# Patient Record
Sex: Male | Born: 1995 | Hispanic: No | Marital: Single | Smoking: Never smoker
Health system: Southern US, Community
[De-identification: ages and names within clinical notes are randomized; demographics above are authoritative.]

## PROBLEM LIST (undated history)

## (undated) DIAGNOSIS — R000 Tachycardia, unspecified: Secondary | ICD-10-CM

## (undated) DIAGNOSIS — K219 Gastro-esophageal reflux disease without esophagitis: Secondary | ICD-10-CM

## (undated) DIAGNOSIS — A048 Other specified bacterial intestinal infections: Secondary | ICD-10-CM

## (undated) DIAGNOSIS — F419 Anxiety disorder, unspecified: Secondary | ICD-10-CM

## (undated) HISTORY — DX: Tachycardia, unspecified: R00.0

## (undated) HISTORY — DX: Other specified bacterial intestinal infections: A04.8

## (undated) HISTORY — PX: NO PAST SURGERIES: SHX2092

## (undated) HISTORY — DX: Anxiety disorder, unspecified: F41.9

## (undated) HISTORY — DX: Gastro-esophageal reflux disease without esophagitis: K21.9

---

## 2017-11-19 DIAGNOSIS — A048 Other specified bacterial intestinal infections: Secondary | ICD-10-CM | POA: Insufficient documentation

## 2017-11-19 DIAGNOSIS — R109 Unspecified abdominal pain: Secondary | ICD-10-CM | POA: Insufficient documentation

## 2018-11-19 DIAGNOSIS — M25369 Other instability, unspecified knee: Secondary | ICD-10-CM | POA: Insufficient documentation

## 2020-04-19 DIAGNOSIS — R6889 Other general symptoms and signs: Secondary | ICD-10-CM | POA: Insufficient documentation

## 2020-04-19 DIAGNOSIS — H5713 Ocular pain, bilateral: Secondary | ICD-10-CM | POA: Insufficient documentation

## 2020-05-19 DIAGNOSIS — Z8619 Personal history of other infectious and parasitic diseases: Secondary | ICD-10-CM | POA: Insufficient documentation

## 2020-11-07 DIAGNOSIS — K0889 Other specified disorders of teeth and supporting structures: Secondary | ICD-10-CM | POA: Insufficient documentation

## 2020-11-15 ENCOUNTER — Other Ambulatory Visit: Payer: Self-pay | Admitting: Internal Medicine

## 2020-11-16 LAB — COMPREHENSIVE METABOLIC PANEL
ALT: 28 IU/L (ref 0–44)
AST: 26 IU/L (ref 0–40)
Albumin/Globulin Ratio: 2 (ref 1.2–2.2)
Albumin: 4.9 g/dL (ref 4.1–5.2)
Alkaline Phosphatase: 90 IU/L (ref 44–121)
BUN/Creatinine Ratio: 19 (ref 9–20)
BUN: 15 mg/dL (ref 6–20)
Bilirubin Total: 0.5 mg/dL (ref 0.0–1.2)
CO2: 24 mmol/L (ref 20–29)
Calcium: 9.9 mg/dL (ref 8.7–10.2)
Chloride: 103 mmol/L (ref 96–106)
Creatinine, Ser: 0.81 mg/dL (ref 0.76–1.27)
GFR calc Af Amer: 146 mL/min/{1.73_m2} (ref >59)
GFR calc non Af Amer: 126 mL/min/{1.73_m2} (ref >59)
Globulin, Total: 2.4 g/dL (ref 1.5–4.5)
Glucose: 94 mg/dL (ref 65–99)
Potassium: 4.7 mmol/L (ref 3.5–5.2)
Sodium: 140 mmol/L (ref 134–144)
Total Protein: 7.3 g/dL (ref 6.0–8.5)

## 2020-11-16 LAB — URINALYSIS, ROUTINE W REFLEX MICROSCOPIC
Bilirubin, UA: NEGATIVE
Glucose, UA: NEGATIVE
Ketones, UA: NEGATIVE
Leukocytes,UA: NEGATIVE
Nitrite, UA: NEGATIVE
Protein,UA: NEGATIVE
RBC, UA: NEGATIVE
Specific Gravity, UA: 1.025 (ref 1.005–1.030)
Urobilinogen, Ur: 0.2 mg/dL (ref 0.2–1.0)
pH, UA: 5.5 (ref 5.0–7.5)

## 2020-11-16 LAB — CBC WITH DIFFERENTIAL/PLATELET
Basophils Absolute: 0 10*3/uL (ref 0.0–0.2)
Basos: 1 %
EOS (ABSOLUTE): 0.3 10*3/uL (ref 0.0–0.4)
Eos: 3 %
Hematocrit: 47.6 % (ref 37.5–51.0)
Hemoglobin: 16.2 g/dL (ref 13.0–17.7)
Immature Grans (Abs): 0 10*3/uL (ref 0.0–0.1)
Immature Granulocytes: 0 %
Lymphocytes Absolute: 3.6 10*3/uL — ABNORMAL HIGH (ref 0.7–3.1)
Lymphs: 47 %
MCH: 28.8 pg (ref 26.6–33.0)
MCHC: 34 g/dL (ref 31.5–35.7)
MCV: 85 fL (ref 79–97)
Monocytes Absolute: 0.7 10*3/uL (ref 0.1–0.9)
Monocytes: 8 %
Neutrophils Absolute: 3.2 10*3/uL (ref 1.4–7.0)
Neutrophils: 41 %
Platelets: 304 10*3/uL (ref 150–450)
RBC: 5.63 x10E6/uL (ref 4.14–5.80)
RDW: 12.3 % (ref 11.6–15.4)
WBC: 7.8 10*3/uL (ref 3.4–10.8)

## 2020-11-16 LAB — LIPID PANEL W/O CHOL/HDL RATIO
Cholesterol, Total: 104 mg/dL (ref 100–199)
HDL: 46 mg/dL (ref >39)
LDL Chol Calc (NIH): 42 mg/dL (ref 0–99)
Triglycerides: 77 mg/dL (ref 0–149)
VLDL Cholesterol Cal: 16 mg/dL (ref 5–40)

## 2020-11-16 LAB — T4, FREE: Free T4: 1.39 ng/dL (ref 0.82–1.77)

## 2020-11-16 LAB — TSH: TSH: 4.34 u[IU]/mL (ref 0.450–4.500)

## 2020-11-16 LAB — HGB A1C W/O EAG: Hgb A1c MFr Bld: 5.5 % (ref 4.8–5.6)

## 2020-12-03 ENCOUNTER — Other Ambulatory Visit: Payer: Self-pay

## 2020-12-03 ENCOUNTER — Encounter: Payer: Self-pay | Admitting: Internal Medicine

## 2020-12-03 ENCOUNTER — Ambulatory Visit: Payer: Self-pay | Admitting: Internal Medicine

## 2020-12-03 VITALS — BP 124/80 | HR 82

## 2020-12-03 DIAGNOSIS — H538 Other visual disturbances: Secondary | ICD-10-CM

## 2020-12-03 DIAGNOSIS — N3943 Post-void dribbling: Secondary | ICD-10-CM

## 2020-12-03 DIAGNOSIS — K219 Gastro-esophageal reflux disease without esophagitis: Secondary | ICD-10-CM

## 2020-12-03 DIAGNOSIS — F064 Anxiety disorder due to known physiological condition: Secondary | ICD-10-CM

## 2020-12-03 MED ORDER — ESCITALOPRAM OXALATE 10 MG PO TABS: 10.0000 mg | ORAL_TABLET | Freq: Every day | ORAL | 1 refills | Status: DC

## 2020-12-03 MED ORDER — OMEPRAZOLE 20 MG PO CPDR: 20.0000 mg | DELAYED_RELEASE_CAPSULE | Freq: Every day | ORAL | 3 refills | Status: DC

## 2020-12-03 NOTE — Progress Notes (Signed)
Internal MEDICINE  Office Visit Note  Patient Name: Peter Snow  [id number]  [id number]  Date of Service: 12/03/2020  Chief Complaint  Patient presents with  . Urinary Frequency       . Eye Problem  . GI Problem  . Stress    HPI Pt is connected via tele visit, has following complaints  1. Under a great deal of stress, unable to rest, friends call him at various times, immigrant from [place]  2. discolored urine with dribbling, denies any burning  3. Eye pain after being on the phone for a while, problem with reading as well, eyes get red  4. Has h/o H pylori, was treated however c/o abdominal pain and heart burn  Current Medication: Outpatient Encounter Medications as of 12/03/2020  Medication Sig  . escitalopram (LEXAPRO) 10 MG tablet Take 1 tablet (10 mg total) by mouth daily. With supper for stress  . omeprazole (PRILOSEC) 20 MG capsule Take 1 capsule (20 mg total) by mouth daily.   No facility-administered encounter medications on file as of 12/03/2020.    Surgical History: History reviewed. No pertinent surgical history.  Medical History: No past medical history on file.  Family History: History reviewed. No pertinent family history.  Social History   Socioeconomic History  . Marital status: Single    Spouse name: Not on file  . Number of children: Not on file  . Years of education: Not on file  . Highest education level: Not on file  Occupational History  . Occupation: unemployed  Tobacco Use  . Smoking status: Never Smoker  . Smokeless tobacco: Never Used  Vaping Use  . Vaping Use: Never used  Substance and Sexual Activity  . Alcohol use: Never  . Drug use: Never  . Sexual activity: Not on file  Other Topics Concern  . Not on file  Social History Narrative  . Not on file   Social Determinants of Health   Financial Resource Strain: Not on file  Food Insecurity: Not on file  Transportation Needs: Not on file  Physical Activity: Not on  file  Stress: Not on file  Social Connections: Not on file  Intimate Partner Violence: Not on file      Review of Systems  Eyes: Positive for redness.  Cardiovascular: Negative.   Genitourinary: Positive for decreased urine volume. Negative for scrotal swelling.       Dribbling   Psychiatric/Behavioral: Positive for decreased concentration, dysphoric mood and sleep disturbance. The patient is nervous/anxious.     Vital Signs: BP 124/80   Pulse 82   SpO2 98%    Physical Exam NAD    Assessment/Plan: 1. GERD without esophagitis Might need future re-testing for H pylori, will start omeprazole  - omeprazole (PRILOSEC) 20 MG capsule; Take 1 capsule (20 mg total) by mouth daily.  Dispense: 30 capsule; Refill: 3  2. 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 With supper for stress  Dispense: 30 tablet; Refill: 1  4. Blurred vision Try reading glasses if no improvement will be seen by ophthalmology  - Ambulatory referral to Ophthalmology  General Counseling: Peter Snow verbalizes understanding of the findings of todays visit and agrees with plan of treatment. I have discussed any further diagnostic  evaluation that may be needed or ordered today. We also reviewed his medications today. he has been encouraged to call the office with any questions or concerns that should arise related to todays visit.    Orders Placed This Encounter  Procedures  . Urinalysis, Routine w reflex microscopic  . Ambulatory referral to Ophthalmology    Meds ordered this encounter  Medications  . escitalopram (LEXAPRO) 10 MG tablet    Sig: Take 1 tablet (10 mg total) by mouth daily. With supper for stress    Dispense:  30  tablet    Refill:  1  . omeprazole (PRILOSEC) 20 MG capsule    Sig: Take 1 capsule (20 mg total) by mouth daily.    Dispense:  30 capsule    Refill:  3    Total time spent:10 Minutes Time spent includes review of chart, medications, test results, and follow up plan with the patient.      Dr Lyndon Code Internal medicine

## 2020-12-03 NOTE — Progress Notes (Signed)
Medication Note  Omeprazole - Dispensed 60 capsules Remaining Refills: 2  Escitalopram (Lexapro) - Dispensed 60 tablets Remaining Refills: 0

## 2021-03-28 ENCOUNTER — Other Ambulatory Visit: Payer: Self-pay

## 2021-03-28 ENCOUNTER — Ambulatory Visit (HOSPITAL_COMMUNITY)
Admission: EM | Admit: 2021-03-28 | Discharge: 2021-03-28 | Disposition: A | Discharge status: Home / Self Care | Payer: Medicaid Other | Attending: Internal Medicine | Admitting: Internal Medicine

## 2021-03-28 DIAGNOSIS — B3789 Other sites of candidiasis: Secondary | ICD-10-CM

## 2021-03-28 DIAGNOSIS — L0291 Cutaneous abscess, unspecified: Secondary | ICD-10-CM

## 2021-03-28 MED ORDER — DOXYCYCLINE HYCLATE 100 MG PO CAPS: 100.0000 mg | ORAL_CAPSULE | Freq: Two times a day (BID) | ORAL | 0 refills | Status: DC

## 2021-03-28 MED ORDER — NYSTATIN 100000 UNIT/GM EX CREA: TOPICAL_CREAM | CUTANEOUS | 0 refills | Status: DC

## 2021-03-28 NOTE — Discharge Instructions (Addendum)
Apply heat on the abscess over the gauze/bandage for 20 minutes. With a heating pack, or get in the bath tub and sit on very warm water. The heat will help the puss continue to drain.  Come back if you get worse.   Call [facility] and ask you need to get established with a primary care doctor, so you can have a physical/check up and blood work.

## 2021-03-28 NOTE — ED Provider Notes (Signed)
[facility]    CSN: [id number] Arrival date & time: 03/28/21  1608      History   Chief Complaint Chief Complaint  Patient presents with  . Abscess    HPI Peter Snow is a 25 y.o. male who developed an abscess on his R buttocks cheek one week ago, but area got worse x 3 days and been draining today.   2- has been having perianal itching x 3 months, he gets a white skin color changes and when he washed it this week was painful.   He does not have a PCP Past Medical History:  Diagnosis Date  . H. pylori infection     Patient Active Problem List   Diagnosis Date Noted  . Tooth pain 11/07/2020  . Gonorrhea 05/2020  . Eye pain, bilateral 04/2020  . Forgetfulness 04/2020  . Knee buckling 2020  . Stomach pain 2019  . Helicobacter pylori stool test positive 2019    No past surgical history on file.     Home Medications    Prior to Admission medications   Medication Sig Start Date End Date Taking? Authorizing Provider  doxycycline (VIBRAMYCIN) 100 MG capsule Take 1 capsule (100 mg total) by mouth 2 (two) times daily. For abscess 03/28/21  Yes Rodriguez-Southworth, Nettie Elm, PA-C  nystatin cream (MYCOSTATIN) Apply to affected area 2 times daily x 7 days for fungal infection 03/28/21  Yes Rodriguez-Southworth, Nettie Elm, PA-C  escitalopram (LEXAPRO) 10 MG tablet Take 1 tablet (10 mg total) by mouth daily. With supper for stress 12/03/20   Lyndon Code, MD  omeprazole (PRILOSEC) 20 MG capsule Take 1 capsule (20 mg total) by mouth daily. 12/03/20   Lyndon Code, MD    Family History No family history on file.  Social History Social History   Tobacco Use  . Smoking status: Never Smoker  . Smokeless tobacco: Never Used  Vaping Use  . Vaping Use: Never used  Substance Use Topics  . Alcohol use: Never  . Drug use: Never     Allergies   Patient has no known allergies.   Review of Systems Review of Systems  Constitutional: Positive for appetite  change and unexpected weight change. Negative for fever.  Gastrointestinal: Negative for abdominal pain, diarrhea, nausea and vomiting.  Genitourinary:       + perianal itching   Musculoskeletal: Negative for gait problem and myalgias.  Skin: Positive for wound. Negative for rash.       Gets perianal itching x 3 months and gets white matter around anal skin. Denies internal anal itching.      Physical Exam Triage Vital Signs ED Triage Vitals  Enc Vitals Group     BP 03/28/21 1721 123/88     Pulse Rate 03/28/21 1721 89     Resp 03/28/21 1721 17     Temp 03/28/21 1721 98.6 F (37 C)     Temp Source 03/28/21 1721 Oral     SpO2 03/28/21 1721 100 %     Weight --      Height --      Head Circumference --      Peak Flow --      Pain Score 03/28/21 1718 6     Pain Loc --      Pain Edu? --      Excl. in GC? --    No data found.  Updated Vital Signs BP 123/88 (BP Location: Right Arm)   Pulse 89   Temp  98.6 F (37 C) (Oral)   Resp 17   SpO2 100%   Visual Acuity Right Eye Distance:   Left Eye Distance:   Bilateral Distance:    Right Eye Near:   Left Eye Near:    Bilateral Near:     Physical Exam Vitals and nursing note reviewed.  Constitutional:      General: He is not in acute distress.    Appearance: He is normal weight. He is not toxic-appearing.  HENT:     Right Ear: External ear normal.     Left Ear: External ear normal.  Eyes:     General: No scleral icterus.    Conjunctiva/sclera: Conjunctivae normal.  Pulmonary:     Effort: Pulmonary effort is normal.  Genitourinary:    Rectum: Normal.     Comments: No rashes noted around perianal region  Musculoskeletal:        General: Normal range of motion.     Cervical back: Neck supple.  Skin:    General: Skin is warm and dry.     Findings: No rash.     Comments: R BUTTOCKS- with 3x3 cm abscess which is draining. Does not extend to the anal area. I massaged it to drain some more of the puss since it is open and  got about 3 ml more. I reapplied dressing and tape over it.   Neurological:     Mental Status: He is alert and oriented to person, place, and time.     Gait: Gait normal.  Psychiatric:        Mood and Affect: Mood normal.        Behavior: Behavior normal.        Thought Content: Thought content normal.        Judgment: Judgment normal.      UC Treatments / Results  Labs (all labs ordered are listed, but only abnormal results are displayed) Labs Reviewed - No data to display  EKG   Radiology No results found.  Procedures Procedures (including critical care time)  Medications Ordered in UC Medications - No data to display  Initial Impression / Assessment and Plan / UC Course  I have reviewed the triage vital signs and the nursing notes. I placed him on Doxy for 10 days as noted. See instructions.  I also prescribed him Nystatin cream for the intermittent itching he gets Needs to get established to see a PCP for a physical.    Final Clinical Impressions(s) / UC Diagnoses   Final diagnoses:  Perianal candidiasis  Abscess     Discharge Instructions     Apply heat on the abscess over the gauze/bandage for 20 minutes. With a heating pack, or get in the bath tub and sit on very warm water. The heat will help the puss continue to drain.  Come back if you get worse.   Call [facility] and ask you need to get established with a primary care doctor, so you can have a physical/check up and blood work.     ED Prescriptions    Medication Sig Dispense Auth. Provider   doxycycline (VIBRAMYCIN) 100 MG capsule Take 1 capsule (100 mg total) by mouth 2 (two) times daily. For abscess 20 capsule Rodriguez-Southworth, Nettie Elm, PA-C   nystatin cream (MYCOSTATIN) Apply to affected area 2 times daily x 7 days for fungal infection 30 g Rodriguez-Southworth, Nettie Elm, PA-C     PDMP not reviewed this encounter.   Garey Ham, [place] 03/28/21 2058

## 2021-03-28 NOTE — ED Triage Notes (Addendum)
Pt reports having an abscess in the right buttock x 1 week, bloody drainage x 4 days, low appetite x 3 days,, fever last night.   Pt reports swelling and pain in the left ankle x 1 week from scratching.

## 2021-04-04 ENCOUNTER — Ambulatory Visit
Admission: RE | Admit: 2021-04-04 | Discharge: 2021-04-04 | Disposition: A | Discharge status: Home / Self Care | Payer: No Typology Code available for payment source | Source: Ambulatory Visit | Attending: Obstetrics and Gynecology | Admitting: Obstetrics and Gynecology

## 2021-04-04 ENCOUNTER — Other Ambulatory Visit: Payer: Self-pay | Admitting: Obstetrics and Gynecology

## 2021-04-04 ENCOUNTER — Other Ambulatory Visit: Payer: Self-pay

## 2021-04-04 DIAGNOSIS — Z111 Encounter for screening for respiratory tuberculosis: Secondary | ICD-10-CM

## 2021-09-01 ENCOUNTER — Emergency Department (HOSPITAL_BASED_OUTPATIENT_CLINIC_OR_DEPARTMENT_OTHER): Payer: BC Managed Care – PPO

## 2021-09-01 ENCOUNTER — Emergency Department (HOSPITAL_BASED_OUTPATIENT_CLINIC_OR_DEPARTMENT_OTHER)
Admission: EM | Admit: 2021-09-01 | Discharge: 2021-09-01 | Disposition: A | Discharge status: Home / Self Care | Payer: BC Managed Care – PPO | Attending: Emergency Medicine | Admitting: Emergency Medicine

## 2021-09-01 ENCOUNTER — Other Ambulatory Visit: Payer: Self-pay

## 2021-09-01 ENCOUNTER — Encounter (HOSPITAL_BASED_OUTPATIENT_CLINIC_OR_DEPARTMENT_OTHER): Payer: Self-pay

## 2021-09-01 DIAGNOSIS — R0789 Other chest pain: Secondary | ICD-10-CM | POA: Insufficient documentation

## 2021-09-01 DIAGNOSIS — R634 Abnormal weight loss: Secondary | ICD-10-CM | POA: Insufficient documentation

## 2021-09-01 DIAGNOSIS — R5381 Other malaise: Secondary | ICD-10-CM | POA: Insufficient documentation

## 2021-09-01 DIAGNOSIS — R5383 Other fatigue: Secondary | ICD-10-CM | POA: Insufficient documentation

## 2021-09-01 DIAGNOSIS — R079 Chest pain, unspecified: Secondary | ICD-10-CM | POA: Diagnosis present

## 2021-09-01 DIAGNOSIS — K2900 Acute gastritis without bleeding: Secondary | ICD-10-CM

## 2021-09-01 DIAGNOSIS — R0602 Shortness of breath: Secondary | ICD-10-CM | POA: Diagnosis not present

## 2021-09-01 LAB — CBC
HCT: 45.1 % (ref 39.0–52.0)
Hemoglobin: 15 g/dL (ref 13.0–17.0)
MCH: 27.8 pg (ref 26.0–34.0)
MCHC: 33.3 g/dL (ref 30.0–36.0)
MCV: 83.7 fL (ref 80.0–100.0)
Platelets: 292 10*3/uL (ref 150–400)
RBC: 5.39 MIL/uL (ref 4.22–5.81)
RDW: 13 % (ref 11.5–15.5)
WBC: 7.5 10*3/uL (ref 4.0–10.5)
nRBC: 0 % (ref 0.0–0.2)

## 2021-09-01 LAB — BASIC METABOLIC PANEL
Anion gap: 6 (ref 5–15)
BUN: 14 mg/dL (ref 6–20)
CO2: 31 mmol/L (ref 22–32)
Calcium: 9.5 mg/dL (ref 8.9–10.3)
Chloride: 104 mmol/L (ref 98–111)
Creatinine, Ser: 0.96 mg/dL (ref 0.61–1.24)
GFR, Estimated: >60 mL/min (ref >60)
Glucose, Bld: 90 mg/dL (ref 70–99)
Potassium: 4.2 mmol/L (ref 3.5–5.1)
Sodium: 141 mmol/L (ref 135–145)

## 2021-09-01 LAB — URINALYSIS, ROUTINE W REFLEX MICROSCOPIC
Bilirubin Urine: NEGATIVE
Glucose, UA: NEGATIVE mg/dL
Hgb urine dipstick: NEGATIVE
Ketones, ur: NEGATIVE mg/dL
Leukocytes,Ua: NEGATIVE
Nitrite: NEGATIVE
Protein, ur: NEGATIVE mg/dL
Specific Gravity, Urine: 1.019 (ref 1.005–1.030)
pH: 7 (ref 5.0–8.0)

## 2021-09-01 LAB — TROPONIN I (HIGH SENSITIVITY): Troponin I (High Sensitivity): <2 ng/L (ref <18)

## 2021-09-01 MED ORDER — AMOXICILLIN 500 MG PO TABS: 500.0000 mg | ORAL_TABLET | Freq: Two times a day (BID) | ORAL | 0 refills | Status: DC

## 2021-09-01 MED ORDER — CLARITHROMYCIN 500 MG PO TABS: 500.0000 mg | ORAL_TABLET | Freq: Two times a day (BID) | ORAL | 0 refills | Status: DC

## 2021-09-01 MED ORDER — LACTATED RINGERS IV BOLUS
1000.0000 mL | Freq: Once | INTRAVENOUS | Status: AC
  Administered 2021-09-01: 1000 mL via INTRAVENOUS

## 2021-09-01 MED ORDER — OMEPRAZOLE 20 MG PO CPDR: 20.0000 mg | DELAYED_RELEASE_CAPSULE | Freq: Two times a day (BID) | ORAL | 0 refills | Status: DC

## 2021-09-01 NOTE — ED Triage Notes (Signed)
Pt reports chest pain and SHOB x10 days. Denies abdominal pain and N/V/D.

## 2021-09-01 NOTE — ED Notes (Addendum)
Pt concerned for right side chest pain, weight loss of approx 3kg in 10 days, loss of appetite and SOB.  Pt denies n/v/d, fever, night sweats, cough, blood in sputum or other symptoms.  Pt speaking [place] with visitor at bedside but declines translator services.  Pt conversing in [place] with RN.

## 2021-09-01 NOTE — Discharge Instructions (Addendum)
Only take the antibiotic if the breath test is positive.  Start taking the omeprazole no matter what for the next 2 weeks. Avoid spicy foods, ibuprofen or acidic foods.  Follow up with gastroenterology if not improving.

## 2021-09-01 NOTE — ED Provider Notes (Signed)
[facility] EMERGENCY DEPT Provider Note   CSN: [id number] Arrival date & time: 09/01/21  1716     History Chief Complaint  Patient presents with   Chest Pain    Peter Snow is a 25 y.o. male.  Patient is a 25 year old male who immigrated from [place] approximately 6 to 7 months ago who is presenting today with complaints of 1 month of fatigue, occasional body aches and at times exertional dyspnea.  He reports for the last 1 week he has had discomfort and pain in his lungs and chest.  He denies any cough, night sweats, fever.  He does report that anytime he eats he feels like he gets full very quickly.  He has lost weight but denies any diarrhea.  Sometimes will have upper abdominal pain but it is not constant.  He is not currently taking any medication but does report when he moved here he was told that he might have latent TB.  He did get a chest x-ray but never got the results of that chest x-ray.  He has never taken any antibiotics since being here.  The history is provided by the patient. The history is limited by a language barrier. A language interpreter was used.  Chest Pain     Past Medical History:  Diagnosis Date   H. pylori infection     Patient Active Problem List   Diagnosis Date Noted   Tooth pain 11/07/2020   Gonorrhea 05/2020   Eye pain, bilateral 04/2020   Forgetfulness 04/2020   Knee buckling 2020   Stomach pain 2019   Helicobacter pylori stool test positive 2019    History reviewed. No pertinent surgical history.     History reviewed. No pertinent family history.  Social History   Tobacco Use   Smoking status: Never   Smokeless tobacco: Never  Vaping Use   Vaping Use: Never used  Substance Use Topics   Alcohol use: Never   Drug use: Never    Home Medications Prior to Admission medications   Medication Sig Start Date End Date Taking? Authorizing Provider  doxycycline (VIBRAMYCIN) 100 MG capsule Take 1 capsule (100  mg total) by mouth 2 (two) times daily. For abscess 03/28/21   Rodriguez-Southworth, Nettie Elm, PA-C  escitalopram (LEXAPRO) 10 MG tablet Take 1 tablet (10 mg total) by mouth daily. With supper for stress 12/03/20   Lyndon Code, MD  nystatin cream (MYCOSTATIN) Apply to affected area 2 times daily x 7 days for fungal infection 03/28/21   Rodriguez-Southworth, Nettie Elm, PA-C  omeprazole (PRILOSEC) 20 MG capsule Take 1 capsule (20 mg total) by mouth daily. 12/03/20   Lyndon Code, MD    Allergies    Patient has no known allergies.  Review of Systems   Review of Systems  Cardiovascular:  Positive for chest pain.  All other systems reviewed and are negative.  Physical Exam Updated Vital Signs BP 137/85 (BP Location: Right Arm)   Pulse 86   Temp 98.1 F (36.7 C)   Resp 18   Ht 5\' 6"  (1.676 m)   Wt 55.6 kg   SpO2 100%   BMI 19.78 kg/m   Physical Exam Vitals and nursing note reviewed.  Constitutional:      General: He is not in acute distress.    Appearance: He is well-developed.  HENT:     Head: Normocephalic and atraumatic.  Eyes:     Conjunctiva/sclera: Conjunctivae normal.     Pupils: Pupils are equal, round, and  reactive to light.  Cardiovascular:     Rate and Rhythm: Normal rate and regular rhythm.     Pulses: Normal pulses.     Heart sounds: No murmur heard. Pulmonary:     Effort: Pulmonary effort is normal. No respiratory distress.     Breath sounds: Normal breath sounds. No wheezing or rales.  Abdominal:     General: Abdomen is flat. There is no distension.     Palpations: Abdomen is soft. There is no mass.     Tenderness: There is no abdominal tenderness. There is no guarding or rebound.  Musculoskeletal:        General: No tenderness. Normal range of motion.     Cervical back: Normal range of motion and neck supple.     Right lower leg: No edema.     Left lower leg: No edema.  Skin:    General: Skin is warm and dry.     Findings: No erythema or rash.   Neurological:     Mental Status: He is alert and oriented to person, place, and time. Mental status is at baseline.  Psychiatric:        Mood and Affect: Mood normal.        Behavior: Behavior normal.    ED Results / Procedures / Treatments   Labs (all labs ordered are listed, but only abnormal results are displayed) Labs Reviewed  BASIC METABOLIC PANEL  CBC  H. PYLORI ANTIBODY, IGG  TROPONIN I (HIGH SENSITIVITY)    EKG EKG Interpretation  Date/Time:  Friday September 01 2021 17:27:22 EDT Ventricular Rate:  82 PR Interval:  126 QRS Duration: 94 QT Interval:  336 QTC Calculation: 392 R Axis:   93 Text Interpretation: Normal sinus rhythm Right atrial enlargement Rightward axis No previous tracing Confirmed by Gwyneth Sprout ([id number]) on 09/01/2021 6:31:13 PM  Radiology No results found.  Procedures Procedures   Medications Ordered in ED Medications  lactated ringers bolus 1,000 mL (has no administration in time range)    ED Course  I have reviewed the triage vital signs and the nursing notes.  Pertinent labs & imaging results that were available during my care of the patient were reviewed by me and considered in my medical decision making (see chart for details).    MDM Rules/Calculators/A&P                           25 year old male presenting today with vague symptoms of general malaise, fatigue, occasional shortness of breath over the last month but also having chest discomfort for the last week and not able to eat as much as he is having early satiety and weight loss.  He denies any diarrhea, fever, cough or night sweats.  Vital signs are within normal limits.  He does work 12 hours a day and feels like that he is dehydrated and not getting enough fluids.  He also thinks this is a result of not eating well.  Patient's EKG without acute findings, BMP, CBC are within normal limits.  Troponin is negative.  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  Low suspicion for TB at this time as he is not having acute symptoms of this.  However concern for possible H. pylori.  He was treated for this while in [place] but with his ongoing symptoms he was seen upon arrival to the [place] and at that time  they treated him for esophagitis and gastritis and started omeprazole which she is no longer taking but did not test for H. pylori at that time.  7:59 PM Patient's labs are all within normal limits.  Chest x-ray within normal limits and no evidence concerning for TB.  Suspect patient's issues are GI in nature.  H. pylori testing was done.  Discussed with patient starting omeprazole and he was given prescription for antibiotics to start if the testing is abnormal.  Also recommended following up with GI if symptoms persist.  The h pylori serology but also breath test ordered.  MDM   Amount and/or Complexity of Data Reviewed Clinical lab tests: ordered and reviewed Tests in the radiology section of CPT: ordered and reviewed Independent visualization of images, tracings, or specimens: yes     Final Clinical Impression(s) / ED Diagnoses Final diagnoses:  Chest pain    Rx / DC Orders ED Discharge Orders     None        Gwyneth Sprout, MD 09/01/21 2010

## 2021-09-02 ENCOUNTER — Telehealth (HOSPITAL_BASED_OUTPATIENT_CLINIC_OR_DEPARTMENT_OTHER): Payer: Self-pay | Admitting: Emergency Medicine

## 2021-09-02 DIAGNOSIS — R0789 Other chest pain: Secondary | ICD-10-CM | POA: Diagnosis not present

## 2021-09-04 LAB — H. PYLORI ANTIBODY, IGG: H Pylori IgG: 1.29 Index Value — ABNORMAL HIGH (ref 0.00–0.79)

## 2021-09-06 LAB — H. PYLORI ANTIGEN, STOOL: H. Pylori Stool Ag, Eia: POSITIVE — AB

## 2021-10-14 ENCOUNTER — Emergency Department
Admission: EM | Admit: 2021-10-14 | Discharge: 2021-10-14 | Disposition: A | Discharge status: Home / Self Care | Payer: Medicaid - Out of State | Attending: Emergency Medical Services | Admitting: Emergency Medical Services

## 2021-10-14 DIAGNOSIS — R1012 Left upper quadrant pain: Secondary | ICD-10-CM | POA: Insufficient documentation

## 2021-10-14 DIAGNOSIS — R1110 Vomiting, unspecified: Secondary | ICD-10-CM | POA: Insufficient documentation

## 2021-10-14 DIAGNOSIS — F17290 Nicotine dependence, other tobacco product, uncomplicated: Secondary | ICD-10-CM | POA: Insufficient documentation

## 2021-10-14 LAB — CBC AND DIFFERENTIAL
Absolute NRBC: 0 10*3/uL (ref 0.00–0.00)
Basophils Absolute Automated: 0.05 10*3/uL (ref 0.00–0.08)
Basophils Automated: 0.5 %
Eosinophils Absolute Automated: 0.23 10*3/uL (ref 0.00–0.44)
Eosinophils Automated: 2.3 %
Hematocrit: 45.8 % (ref 37.6–49.6)
Hgb: 15.3 g/dL (ref 12.5–17.1)
Immature Granulocytes Absolute: 0.03 10*3/uL (ref 0.00–0.07)
Immature Granulocytes: 0.3 %
Lymphocytes Absolute Automated: 3.8 10*3/uL — ABNORMAL HIGH (ref 0.42–3.22)
Lymphocytes Automated: 37.6 %
MCH: 27.9 pg (ref 25.1–33.5)
MCHC: 33.4 g/dL (ref 31.5–35.8)
MCV: 83.6 fL (ref 78.0–96.0)
MPV: 9.5 fL (ref 8.9–12.5)
Monocytes Absolute Automated: 0.69 10*3/uL (ref 0.21–0.85)
Monocytes: 6.8 %
Neutrophils Absolute: 5.31 10*3/uL (ref 1.10–6.33)
Neutrophils: 52.5 %
Nucleated RBC: 0 /100 WBC (ref 0.0–0.0)
Platelets: 278 10*3/uL (ref 142–346)
RBC: 5.48 10*6/uL (ref 4.20–5.90)
RDW: 13 % (ref 11–15)
WBC: 10.11 10*3/uL — ABNORMAL HIGH (ref 3.10–9.50)

## 2021-10-14 LAB — COMPREHENSIVE METABOLIC PANEL
ALT: 32 U/L (ref 0–55)
AST (SGOT): 19 U/L (ref 5–41)
Albumin/Globulin Ratio: 1.6 (ref 0.9–2.2)
Albumin: 4.7 g/dL (ref 3.5–5.0)
Alkaline Phosphatase: 85 U/L (ref 37–117)
Anion Gap: 5 (ref 5.0–15.0)
BUN: 12 mg/dL (ref 9.0–28.0)
Bilirubin, Total: 1.6 mg/dL — ABNORMAL HIGH (ref 0.2–1.2)
CO2: 31 mEq/L — ABNORMAL HIGH (ref 17–29)
Calcium: 9.9 mg/dL (ref 8.5–10.5)
Chloride: 105 mEq/L (ref 99–111)
Creatinine: 0.8 mg/dL (ref 0.5–1.5)
Globulin: 3 g/dL (ref 2.0–3.6)
Glucose: 117 mg/dL — ABNORMAL HIGH (ref 70–100)
Potassium: 4.5 mEq/L (ref 3.5–5.3)
Protein, Total: 7.7 g/dL (ref 6.0–8.3)
Sodium: 141 mEq/L (ref 135–145)

## 2021-10-14 LAB — TROPONIN I: Troponin I: <0.01 ng/mL (ref 0.00–0.05)

## 2021-10-14 LAB — LIPASE: Lipase: 22 U/L (ref 8–78)

## 2021-10-14 LAB — GFR: EGFR: >60

## 2021-10-14 MED ORDER — SUCRALFATE 1 G PO TABS
1.0000 g | ORAL_TABLET | Freq: Four times a day (QID) | ORAL | 0 refills | Status: AC
Start: 2021-10-14 — End: ?

## 2021-10-14 MED ORDER — DICYCLOMINE HCL 10 MG PO CAPS
10.0000 mg | ORAL_CAPSULE | Freq: Four times a day (QID) | ORAL | 0 refills | Status: AC | PRN
Start: 2021-10-14 — End: 2021-10-29

## 2021-10-14 MED ORDER — DICYCLOMINE HCL 10 MG PO CAPS
10.0000 mg | ORAL_CAPSULE | Freq: Four times a day (QID) | ORAL | 0 refills | Status: DC | PRN
Start: 2021-10-14 — End: 2021-10-14

## 2021-10-14 MED ORDER — SUCRALFATE 1 G PO TABS
1.0000 g | ORAL_TABLET | Freq: Four times a day (QID) | ORAL | 0 refills | Status: DC
Start: 2021-10-14 — End: 2021-10-14
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
## 2021-10-14 NOTE — ED Provider Notes (Addendum)
EMERGENCY DEPARTMENT HISTORY AND PHYSICAL EXAM     None        ED Course       Patient feels better. I have discussed all testing results and plan of care with patient. Patient agrees with going home and following up and return if worsening symptoms. Side effects of medications such as dizziness associated with opioids and muscle relaxants, nausea/vomiting and constipation associated with opioids and potential complications of antibiotics i.e. c. diff, yeast infection, rash, allergic reaction discussed.                 Provider Note:       DDX: nstemi, pna, electrolyte abn, gerd/pud    A/P: more likely gerd/pud, outpatient f/u       Heart Score Calculator    0 points 1 point 2 points    History Slightly suspicious Moderately suspicious Highly suspicious 0   EKG Normal Non-specific repolarization disturbance Significant ST depression 0   Age (years) <45 45-64 ?65 0   Risk factors* No known risk factors 1-2 risk factors ?3 risk factors or history of atherosclerotic disease 0   Initial troponin ?normal limit 1-3 normal limit >3 normal limit 0      Total Score   0     *Risk factors: HTN, hypercholesterolemia, DM, obesity (BMI >30 kg/m), smoking (current, or smoking cessation ?3 month), positive family history (parent or sibling with CVD before age 21); atherosclerotic disease: prior MI, PCI/CABG, CVA/TIA, or peripheral arterial disease.  General Recommendations   (Some patients may require a more conservative plan of care)  Score 0 - 3:  Discharge and follow up with PCP. (Major Adverse Cardiac Event risk 2.5% over next six weeks)  Score 4 - 6: Observe for 6 hours and consider inpatient or early outpatient stress testing in the [facility]. (Major Adverse Cardiac Event risk 20.3% over next six weeks)  Score 7 - 10: Observe and consider in-hospital stress testing or Cardiology consultation. (Major Adverse Cardiac Event risk 72.7% over next six weeks)  A Major Adverse Cardiac Event was defined as all-cause  mortality, myocardial infarction, or coronary revascularization.       HISTORY OF PRESENT ILLNESS   Historian:Patient  Translator Used: No    25 y.o. male h/o gerd here for nausea, abd pain s/p eating onset past few days. No cig use. Occasional huka.     Location of symptoms: abd/chest  Onset of symptoms: acute on chronic   What was patient doing when symptoms started (Context): eating  Severity: moderate  Timing: intermittent  Activities that worsen symptoms: eating  Activities that improve symptoms: none  Quality:   Radiation of symptoms:  Associated signs and Symptoms: see above  Are symptoms worsening? yes        MEDICAL HISTORY     Past Medical History:  No past medical history on file.    Past Surgical History:  No past surgical history on file.    Social History:  Social History     Socioeconomic History    Marital status: Unknown       Family History:  No family history on file.    Allergies:  No Known Allergies    Outpatient Medication:  Discharge Medication List as of 10/14/2021 10:10 PM            REVIEW OF SYSTEMS   ADD ROS  Cp  Abd pain  All other systems reviewed and are negative.    PHYSICAL EXAM  Vitals:    10/14/21 1606   BP: 137/86   Pulse: 81   Resp: 20   Temp: 97.9 F (36.6 C)   SpO2: 100%         Nursing note and vitals reviewed.  Constitutional:  Well developed, well nourished.   Head:  Atraumatic. Normocephalic.    Eyes:  PERRL. EOMI. Conjunctivae are not pale.  ENT:  Mucous membranes are moist and intact.  Patent airway.  Neck:  Supple. Full ROM.    Cardiovascular:  Regular rate. Regular rhythm. No murmurs, rubs, or gallops.  Respiratory:  No evidence of respiratory distress. Clear to auscultation bilaterally.  No wheezing, rales or rhonchi.   GI:  Soft and non-distended. There is no tenderness. No rebound, guarding, or rigidity.  Back:  Full ROM. Nontender.  MSK:  No edema. No cyanosis. No clubbing. Full range of motion in all extremities.  Skin:  Skin is warm and dry.  No diaphoresis.  No rash.   Neurological:  Alert, awake, and appropriate. Normal speech. Motor normal.  Psychiatric:  Good eye contact. Normal interaction, affect, and behavior.          MEDICAL DECISION MAKING     Vital Signs: Reviewed the patient's vital signs.   Nursing Notes: Reviewed and utilized available nursing notes.  Medical Records Reviewed: Reviewed available past medical records.      PROCEDURES        CARDIAC STUDIES        EKG Interpretation:    21:26 NSR at 83 bpm, RAD, no LVH, qrs 88, qtc 408 (-) ST-T changes no prior ekg for comparison as read by me.       IMAGING STUDIES      No orders to display          PULSE OXIMETRY    Oxygen Saturation by Pulse Oximetry: 100%  Interventions: none  Interpretation: normal     EMERGENCY DEPT. MEDICATIONS      ED Medication Orders (From admission, onward)      None            LABORATORY RESULTS    Ordered and independently interpreted AVAILABLE laboratory tests. Please see results section in chart for full details.  Results for orders placed or performed during the hospital encounter of 10/14/21   CBC and differential   Result Value Ref Range    WBC 10.11 (H) 3.10 - 9.50 x10 3/uL    Hgb 15.3 12.5 - 17.1 g/dL    Hematocrit 41.3 24.4 - 49.6 %    Platelets 278 142 - 346 x10 3/uL    RBC 5.48 4.20 - 5.90 x10 6/uL    MCV 83.6 78.0 - 96.0 fL    MCH 27.9 25.1 - 33.5 pg    MCHC 33.4 31.5 - 35.8 g/dL    RDW 13 11 - 15 %    MPV 9.5 8.9 - 12.5 fL    Neutrophils 52.5 None %    Lymphocytes Automated 37.6 None %    Monocytes 6.8 None %    Eosinophils Automated 2.3 None %    Basophils Automated 0.5 None %    Immature Granulocytes 0.3 None %    Nucleated RBC 0.0 0.0 - 0.0 /100 WBC    Neutrophils Absolute 5.31 1.10 - 6.33 x10 3/uL    Lymphocytes Absolute Automated 3.80 (H) 0.42 - 3.22 x10 3/uL    Monocytes Absolute Automated 0.69 0.21 - 0.85 x10 3/uL    Eosinophils Absolute Automated  0.23 0.00 - 0.44 x10 3/uL    Basophils Absolute Automated 0.05 0.00 - 0.08 x10 3/uL    Immature Granulocytes  Absolute 0.03 0.00 - 0.07 x10 3/uL    Absolute NRBC 0.00 0.00 - 0.00 x10 3/uL   Comprehensive metabolic panel   Result Value Ref Range    Glucose 117 (H) 70 - 100 mg/dL    BUN 40.3 9.0 - 47.4 mg/dL    Creatinine 0.8 0.5 - 1.5 mg/dL    Sodium 259 563 - 875 mEq/L    Potassium 4.5 3.5 - 5.3 mEq/L    Chloride 105 99 - 111 mEq/L    CO2 31 (H) 17 - 29 mEq/L    Calcium 9.9 8.5 - 10.5 mg/dL    Protein, Total 7.7 6.0 - 8.3 g/dL    Albumin 4.7 3.5 - 5.0 g/dL    AST (SGOT) 19 5 - 41 U/L    ALT 32 0 - 55 U/L    Alkaline Phosphatase 85 37 - 117 U/L    Bilirubin, Total 1.6 (H) 0.2 - 1.2 mg/dL    Globulin 3.0 2.0 - 3.6 g/dL    Albumin/Globulin Ratio 1.6 0.9 - 2.2    Anion Gap 5.0 5.0 - 15.0   Lipase   Result Value Ref Range    Lipase 22 8 - 78 U/L   Troponin I   Result Value Ref Range    Troponin I <0.01 0.00 - 0.05 ng/mL   GFR   Result Value Ref Range    EGFR >60.0     ECG 12 lead   Result Value Ref Range    Ventricular Rate 83 BPM    Atrial Rate 83 BPM    P-R Interval 134 ms    QRS Duration 88 ms    Q-T Interval 348 ms    QTC Calculation (Bezet) 408 ms    P Axis 74 degrees    R Axis 92 degrees    T Axis 34 degrees    [facility] MUSE NARRATIVE AND IMPRESSION       NORMAL SINUS RHYTHM  RIGHT AXIS DEVIATION  BORDERLINE NORMAL/ABNORMAL ELECTROCARDIOGRAM  NO PREVIOUS ECGS AVAILABLE  Confirmed by Philip Aspen MD, RAFIQ [phone]) on 10/15/2021 12:09:57 PM         CLINICAL DECISION SUPPORT   Heart Score      Flowsheet Row Value   History 0   EKG 0   Risk Factors 0   Total (with age) 0   Onset of pain (time of START of last episode of chest pain)? >6 hrs ago                ATTESTATIONS      Physician Attestation: Miguel Aschoff Almadelia Looman, MD PhD , have been the primary provider for [facility] during this Emergency Dept visit and have reviewed the chart documented for accuracy and agree with its content.       DIAGNOSIS      Diagnosis:  Final diagnoses:   Left upper quadrant abdominal pain       Disposition:  ED Disposition       ED Disposition   Discharge     Condition   --    Date/Time   Sat Oct 14, 2021 10:09 PM    Comment   Levi Castillo [facility] discharge to home/self care.    Condition at disposition: Stable                 Prescriptions:  Discharge  Medication List as of 10/14/2021 10:10 PM        START taking these medications    Details   dicyclomine (BENTYL) 10 MG capsule Take 1 capsule (10 mg) by mouth every 6 (six) hours as needed (pain), Starting Sat 10/14/2021, Until Sun 10/29/2021 at 2359, Print      sucralfate (CARAFATE) 1 g tablet Take 1 tablet (1 g) by mouth 4 (four) times daily, Starting Sat 10/14/2021, Print                    Ariona Deschene, Zadie Rhine, MD PhD  10/15/21 0724       Quaniyah Bugh, Myra Gianotti, MD PhD  10/26/21 [phone]

## 2021-10-14 NOTE — ED Notes (Signed)
Bed: YE2  Expected date:   Expected time:   Means of arrival:   Comments:

## 2021-10-14 NOTE — Discharge Instructions (Signed)
Take over the counter Prilosec (omeprozole) or Nexium by mouth 30 min before meals. Stay away from fatty, spicy, acidic foods.

## 2021-10-14 NOTE — ED Triage Notes (Signed)
[facility] EMERGENCY DEPARTMENT TRIAGE NOTE     Levi Castillo is a 25 y.o. male who presents to the ED with a chief complaint of: Abd Pain, Diarrhea, and Emesis.      Onset: 3 days ago        C/o abd pain and epigastric pain that started 3 days ago Feels nauseated after eating, no emesis. Ater breakfast the other day pt felt like he was dying. Has not been able to eat a lot in the last 3 days.  Hx H. Pylori. Denies fever or SOB. Also c/o headache. C/o burning when urination x 1 year.       LOC: A&Ox4     Vitals: Blood pressure 137/86, pulse 81, temperature 97.9 F (36.6 C), temperature source Oral, resp. rate 20, height 5\' 10"  (1.778 m), weight 56.7 kg, SpO2 100 %.    Meds taken prior to ED arrival: None

## 2021-10-15 ENCOUNTER — Emergency Department (HOSPITAL_BASED_OUTPATIENT_CLINIC_OR_DEPARTMENT_OTHER)
Admission: EM | Admit: 2021-10-15 | Discharge: 2021-10-15 | Disposition: A | Discharge status: Home / Self Care | Payer: Medicaid Other | Attending: Emergency Medicine | Admitting: Emergency Medicine

## 2021-10-15 ENCOUNTER — Other Ambulatory Visit: Payer: Self-pay

## 2021-10-15 ENCOUNTER — Encounter (HOSPITAL_BASED_OUTPATIENT_CLINIC_OR_DEPARTMENT_OTHER): Payer: Self-pay

## 2021-10-15 DIAGNOSIS — B9681 Helicobacter pylori [H. pylori] as the cause of diseases classified elsewhere: Secondary | ICD-10-CM | POA: Insufficient documentation

## 2021-10-15 DIAGNOSIS — R1084 Generalized abdominal pain: Secondary | ICD-10-CM | POA: Diagnosis present

## 2021-10-15 DIAGNOSIS — K279 Peptic ulcer, site unspecified, unspecified as acute or chronic, without hemorrhage or perforation: Secondary | ICD-10-CM

## 2021-10-15 LAB — CBC
HCT: 43.1 % (ref 39.0–52.0)
Hemoglobin: 14.4 g/dL (ref 13.0–17.0)
MCH: 27.9 pg (ref 26.0–34.0)
MCHC: 33.4 g/dL (ref 30.0–36.0)
MCV: 83.4 fL (ref 80.0–100.0)
Platelets: 243 10*3/uL (ref 150–400)
RBC: 5.17 MIL/uL (ref 4.22–5.81)
RDW: 12.9 % (ref 11.5–15.5)
WBC: 6.7 10*3/uL (ref 4.0–10.5)
nRBC: 0 % (ref 0.0–0.2)

## 2021-10-15 LAB — COMPREHENSIVE METABOLIC PANEL
ALT: 22 U/L (ref 0–44)
AST: 16 U/L (ref 15–41)
Albumin: 4.7 g/dL (ref 3.5–5.0)
Alkaline Phosphatase: 66 U/L (ref 38–126)
Anion gap: 6 (ref 5–15)
BUN: 10 mg/dL (ref 6–20)
CO2: 30 mmol/L (ref 22–32)
Calcium: 9.9 mg/dL (ref 8.9–10.3)
Chloride: 103 mmol/L (ref 98–111)
Creatinine, Ser: 0.68 mg/dL (ref 0.61–1.24)
GFR, Estimated: >60 mL/min (ref >60)
Glucose, Bld: 91 mg/dL (ref 70–99)
Potassium: 4 mmol/L (ref 3.5–5.1)
Sodium: 139 mmol/L (ref 135–145)
Total Bilirubin: 1.6 mg/dL — ABNORMAL HIGH (ref 0.3–1.2)
Total Protein: 7 g/dL (ref 6.5–8.1)

## 2021-10-15 LAB — LIPASE, BLOOD: Lipase: 22 U/L (ref 11–51)

## 2021-10-15 LAB — ECG 12-LEAD
Atrial Rate: 83 {beats}/min
IHS MUSE NARRATIVE AND IMPRESSION: NORMAL
P Axis: 74 degrees
P-R Interval: 134 ms
Q-T Interval: 348 ms
QRS Duration: 88 ms
QTC Calculation (Bezet): 408 ms
R Axis: 92 degrees
T Axis: 34 degrees
Ventricular Rate: 83 {beats}/min

## 2021-10-15 MED ORDER — AMOXICILLIN 500 MG PO CAPS: 1000.0000 mg | ORAL_CAPSULE | Freq: Two times a day (BID) | ORAL | 0 refills | Status: AC

## 2021-10-15 MED ORDER — ALUM & MAG HYDROXIDE-SIMETH 200-200-20 MG/5ML PO SUSP
30.0000 mL | Freq: Once | ORAL | Status: AC
  Administered 2021-10-15: 21:00:00 30 mL via ORAL
  Filled 2021-10-15: qty 30

## 2021-10-15 MED ORDER — OMEPRAZOLE 20 MG PO CPDR: 20.0000 mg | DELAYED_RELEASE_CAPSULE | Freq: Every day | ORAL | 0 refills | Status: DC

## 2021-10-15 MED ORDER — CLARITHROMYCIN 500 MG PO TABS: 500.0000 mg | ORAL_TABLET | Freq: Two times a day (BID) | ORAL | 0 refills | Status: AC

## 2021-10-15 NOTE — ED Triage Notes (Signed)
Interpreter [phone]   Pt reports he was prescribed medications yesterday but cant take d/t persistent nausea.   Pt also reports this is not only bothering his stomach but causing shaking to his hands and legs and pressure on his brain. He's having anxiety attacks feeling like he's going to die.   Pt had a nose bleed, went out on his balcony because he couldn't breathe, he walked to the park and started to feel better.

## 2021-10-15 NOTE — ED Provider Notes (Signed)
[facility] EMERGENCY DEPT Provider Note   CSN: [id number] Arrival date & time: 10/15/21  2005     History Chief Complaint  Patient presents with   Abdominal Pain    Peter Snow is a 25 y.o. male.  HPI  HPI was collected while using a interpreter.  Patient with history including H. pylori he presents the emergency department with multiple complaints.  Patient's main complaint is stomach pain, been going on for last 3 days, pain is in the middle of his abdomen does not radiate, pain is constant, describes as a burning-like sensation, worsened with p.o. intake.  Denies constipation, diarrhea, hematochezia, melena, urinary symptoms.  States that he was diagnosed with H. pylori back in [place] and was seen here a month ago is unsure if he has H. pylori.  He also notes that he had a nosebleed yesterday but this has since resolved has no complaints with this.  He also notes that he is having a slight headache headache is in the middle of his head, this headache is intermittent, no change in vision, paresthesia or weakness upper lower extremities.  Denies  recent head trauma, is not on anticoagulant.  He states he is not take anything for his headache.  After reviewing patient's chart patient seen here 1 month ago for similar presentation, lab work shows H. pylori, he is not been on any antibiotics for this.    Past Medical History:  Diagnosis Date   H. pylori infection     Patient Active Problem List   Diagnosis Date Noted   Tooth pain 11/07/2020   Gonorrhea 05/2020   Eye pain, bilateral 04/2020   Forgetfulness 04/2020   Knee buckling 2020   Stomach pain 7902   Helicobacter pylori stool test positive 2019    No past surgical history on file.     No family history on file.  Social History   Tobacco Use   Smoking status: Never   Smokeless tobacco: Never  Vaping Use   Vaping Use: Never used  Substance Use Topics   Alcohol use: Never   Drug use:  Never    Home Medications Prior to Admission medications   Medication Sig Start Date End Date Taking? Authorizing Provider  amoxicillin (AMOXIL) 500 MG capsule Take 2 capsules (1,000 mg total) by mouth 2 (two) times daily for 14 days. 10/15/21 10/29/21 Yes Marcello Fennel, PA-C  clarithromycin (BIAXIN) 500 MG tablet Take 1 tablet (500 mg total) by mouth 2 (two) times daily for 14 days. 10/15/21 10/29/21 Yes Marcello Fennel, PA-C  omeprazole (PRILOSEC) 20 MG capsule Take 1 capsule (20 mg total) by mouth daily. 10/15/21 11/14/21 Yes Marcello Fennel, PA-C  doxycycline (VIBRAMYCIN) 100 MG capsule Take 1 capsule (100 mg total) by mouth 2 (two) times daily. For abscess 03/28/21   Rodriguez-Southworth, Sunday Spillers, PA-C  escitalopram (LEXAPRO) 10 MG tablet Take 1 tablet (10 mg total) by mouth daily. With supper for stress 12/03/20   Lavera Guise, MD  nystatin cream (MYCOSTATIN) Apply to affected area 2 times daily x 7 days for fungal infection 03/28/21   Rodriguez-Southworth, Sunday Spillers, PA-C    Allergies    Patient has no known allergies.  Review of Systems   Review of Systems  Constitutional:  Negative for chills and fever.  HENT:  Positive for nosebleeds. Negative for congestion and trouble swallowing.   Respiratory:  Negative for shortness of breath.   Cardiovascular:  Negative for chest pain.  Gastrointestinal:  Positive for abdominal  pain and nausea. Negative for constipation, diarrhea and vomiting.  Genitourinary:  Negative for enuresis.  Musculoskeletal:  Negative for back pain.  Skin:  Negative for rash.  Neurological:  Positive for headaches. Negative for dizziness.  Hematological:  Does not bruise/bleed easily.   Physical Exam Updated Vital Signs BP 125/83   Pulse 83   Temp 98 F (36.7 C)   Resp 16   SpO2 100%   Physical Exam Vitals and nursing note reviewed.  Constitutional:      General: He is not in acute distress.    Appearance: He is not ill-appearing.  HENT:      Head: Normocephalic and atraumatic.     Nose: No congestion.     Mouth/Throat:     Mouth: Mucous membranes are moist.     Pharynx: Oropharynx is clear.  Eyes:     Extraocular Movements: Extraocular movements intact.     Conjunctiva/sclera: Conjunctivae normal.     Pupils: Pupils are equal, round, and reactive to light.  Cardiovascular:     Rate and Rhythm: Normal rate and regular rhythm.     Pulses: Normal pulses.     Heart sounds: No murmur heard.   No friction rub. No gallop.  Pulmonary:     Effort: No respiratory distress.     Breath sounds: No wheezing, rhonchi or rales.  Abdominal:     Palpations: Abdomen is soft.     Tenderness: There is no abdominal tenderness. There is no right CVA tenderness or left CVA tenderness.     Comments: Abdomen nondistended normal bowel sounds, dull to percussion, nontender to palpation, there is no guarding, rebound tenderness, peritoneal sign.  Musculoskeletal:     Comments: Moving all 4 extremities 5 of 5 strength.  Skin:    General: Skin is warm and dry.  Neurological:     Mental Status: He is alert.     GCS: GCS eye subscore is 4. GCS verbal subscore is 5. GCS motor subscore is 6.     Cranial Nerves: No cranial nerve deficit.     Sensory: Sensation is intact.     Motor: No weakness.     Gait: Gait is intact.     Comments: Cranial nerves II through XII grossly intact no difficult word finding, able follow two-step commands, no unilateral weakness present.  Psychiatric:        Mood and Affect: Mood normal.    ED Results / Procedures / Treatments   Labs (all labs ordered are listed, but only abnormal results are displayed) Labs Reviewed  COMPREHENSIVE METABOLIC PANEL - Abnormal; Notable for the following components:      Result Value   Total Bilirubin 1.6 (*)    All other components within normal limits  LIPASE, BLOOD  CBC    EKG None  Radiology No results found.  Procedures Procedures   Medications Ordered in  ED Medications  alum & mag hydroxide-simeth (MAALOX/MYLANTA) 200-200-20 MG/5ML suspension 30 mL (30 mLs Oral Given 10/15/21 2124)    ED Course  I have reviewed the triage vital signs and the nursing notes.  Pertinent labs & imaging results that were available during my care of the patient were reviewed by me and considered in my medical decision making (see chart for details).    MDM Rules/Calculators/A&P                          Initial impression-Main complaint stomach pain.  Alert no  acute distress vital signs reassuring.  Likely suffering from H. pylori, will provide GI cocktail obtain basic lab work and reassess.  Work-up-CBC unremarkable, CMP unremarkable, lipase unremarkable.  Reassessment-patient is tolerating p.o., has no complaints this time, patient agreed for discharge.  Rule out-low suspicion for intracranial head bleed and/or CVA as he has no head trauma, not on anticoagulant, no neurodeficits present my exam.  Low suspicion for vertebral/carotid dissection as patient is a 2 of etiology.  Low session for meningitis as he has no meningeal sign present my exam. low suspicion for lower lobe pneumonia as lung sounds are clear bilaterally, will defer imaging at this time.  I have low suspicion for liver or gallbladder abnormality as she has no right upper quadrant tenderness, liver enzymes, alk phos, T bili all within normal limits.  Low suspicion for pancreatitis as lipase is within normal limits.  Low suspicion for ruptured stomach ulcer as she has no peritoneal sign present on exam.  Low suspicion for bowel obstruction as abdomen is nondistended normal bowel sounds, so passing gas and having normal bowel movements.  Low suspicion for complicated diverticulitis as she is nontoxic-appearing, vital signs reassuring no leukocytosis present.  Low suspicion for appendicitis as she has no right lower quadrant tenderness, vital signs reassuring.  I have low suspicion for intra-abdominal  infection as she has low risk factors, vital signs reassuring, no leukocytosis, will defer imaging at this time.   Plan-  Stomach pain-likely secondary due to age pylori, will place him on triple therapy, follow-up with PCP for further evaluation. Headache-likely cluster headache, will recommend over-the-counter pain medications, follow-up PCP for further evaluation.  Vital signs have remained stable, no indication for hospital admission.   Patient given at home care as well strict return precautions.  Patient verbalized that they understood agreed to said plan.  Final Clinical Impression(s) / ED Diagnoses Final diagnoses:  Generalized abdominal pain  H pylori ulcer    Rx / DC Orders ED Discharge Orders          Ordered    omeprazole (PRILOSEC) 20 MG capsule  Daily        10/15/21 2206    clarithromycin (BIAXIN) 500 MG tablet  2 times daily        10/15/21 2206    amoxicillin (AMOXIL) 500 MG capsule  2 times daily        10/15/21 2206             Aron Baba 10/15/21 2208    Blanchie Dessert, MD 10/19/21 1337

## 2021-10-15 NOTE — ED Triage Notes (Signed)
[id number] Dari interpretor   Pt presents with concerns for ongoing symptoms of H-pylor. Dx here approx 1 month ago. Unable to eat d/t discomfort and burning. Smell of food causes him to be nauseous. Last ate 3 days ago

## 2021-10-15 NOTE — Discharge Instructions (Signed)
You have H. pylori, I have started you on antibiotics please take as prescribed.  also started you on an acid pill please  take as prescribed.  Given you food recommendations  to decrease stomach pain please review  You must follow-up with your PCP and/or GI for further evaluation.  Given contact information above please call  Come back to the emergency department if you develop chest pain, shortness of breath, severe abdominal pain, uncontrolled nausea, vomiting, diarrhea.

## 2021-10-17 ENCOUNTER — Encounter: Payer: Self-pay | Admitting: Physician Assistant

## 2021-10-17 ENCOUNTER — Ambulatory Visit: Payer: Medicaid Other | Attending: Physician Assistant | Admitting: Physician Assistant

## 2021-10-17 ENCOUNTER — Other Ambulatory Visit: Payer: Self-pay

## 2021-10-17 VITALS — BP 126/83 | HR 77 | Temp 98.5°F | Resp 18 | Ht 67.0 in | Wt 124.0 lb

## 2021-10-17 DIAGNOSIS — A048 Other specified bacterial intestinal infections: Secondary | ICD-10-CM | POA: Diagnosis not present

## 2021-10-17 DIAGNOSIS — Z681 Body mass index (BMI) 19 or less, adult: Secondary | ICD-10-CM

## 2021-10-17 MED ORDER — OMEPRAZOLE 20 MG PO CPDR: 20.0000 mg | DELAYED_RELEASE_CAPSULE | Freq: Two times a day (BID) | ORAL | 1 refills | Status: DC

## 2021-10-17 NOTE — Patient Instructions (Addendum)
Continue taking the antibiotics as directed, you will start taking the omeprazole twice a day.  Make sure that you are staying well hydrated as well.  You can use the Neosporin cream that we provided to you around your rectum to help with the itching.  I hope that you feel better soon  Roney Jaffe, PA-C Physician Assistant [facility] [email or web address]

## 2021-10-17 NOTE — Progress Notes (Signed)
New Patient Office Visit  Subjective:  Patient ID: Peter Snow, male    DOB: 03-21-1996  Age: 25 y.o. MRN: [id number]  CC:  Chief Complaint  Patient presents with   Abdominal Pain     HPI [name] reports that he was seen in the emergency department on September 09, 6894   25 year old male presenting today with vague symptoms of general malaise, fatigue, occasional shortness of breath over the last month but also having chest discomfort for the last week and not able to eat as much as he is having early satiety and weight loss.  He denies any diarrhea, fever, cough or night sweats.  Vital signs are within normal limits.  He does work 12 hours a day and feels like that he is dehydrated and not getting enough fluids.  He also thinks this is a result of not eating well.  Patient's EKG without acute findings, BMP, CBC are within normal limits.  Troponin is negative.  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  Low suspicion for TB at this time as he is not having acute symptoms of this.  However concern for possible H. pylori.  He was treated for this while in [place] but with his ongoing symptoms he was seen upon arrival to the [place] and at that time they treated him for esophagitis and gastritis and started omeprazole which she is no longer taking but did not test for H. pylori at that time.   7:59 PM Patient's labs are all within normal limits.  Chest x-ray within normal limits and no evidence concerning for TB.  Suspect patient's issues are GI in nature.  H. pylori testing was done.  Discussed with patient starting omeprazole and he was given prescription for antibiotics to start if the testing is abnormal.  Also recommended following up with GI if symptoms persist.  The h pylori serology but also breath test ordered.  Reports that he then was seen again at the emergency department on October 15, 2021.     Patient with history  including H. pylori he presents the emergency department with multiple complaints.  Patient's main complaint is stomach pain, been going on for last 3 days, pain is in the middle of his abdomen does not radiate, pain is constant, describes as a burning-like sensation, worsened with p.o. intake.  Denies constipation, diarrhea, hematochezia, melena, urinary symptoms.  States that he was diagnosed with H. pylori back in [place] and was seen here a month ago is unsure if he has H. pylori.  He also notes that he had a nosebleed yesterday but this has since resolved has no complaints with this.  He also notes that he is having a slight headache headache is in the middle of his head, this headache is intermittent, no change in vision, paresthesia or weakness upper lower extremities.  Denies  recent head trauma, is not on anticoagulant.  He states he is not take anything for his headache.   After reviewing patient's chart patient seen here 1 month ago for similar presentation, lab work shows H. pylori, he is not been on any antibiotics for this.   Initial impression-Main complaint stomach pain.  Alert no acute distress vital signs reassuring.  Likely suffering from H. pylori, will provide GI cocktail obtain basic lab work and reassess.   Work-up-CBC unremarkable, CMP unremarkable, lipase unremarkable.   Reassessment-patient is tolerating p.o., has no complaints this time, patient agreed  for discharge.   Rule out-low suspicion for intracranial head bleed and/or CVA as he has no head trauma, not on anticoagulant, no neurodeficits present my exam.  Low suspicion for vertebral/carotid dissection as patient is a 2 of etiology.  Low session for meningitis as he has no meningeal sign present my exam. low suspicion for lower lobe pneumonia as lung sounds are clear bilaterally, will defer imaging at this time.  I have low suspicion for liver or gallbladder abnormality as she has no right upper quadrant tenderness, liver  enzymes, alk phos, T bili all within normal limits.  Low suspicion for pancreatitis as lipase is within normal limits.  Low suspicion for ruptured stomach ulcer as she has no peritoneal sign present on exam.  Low suspicion for bowel obstruction as abdomen is nondistended normal bowel sounds, so passing gas and having normal bowel movements.  Low suspicion for complicated diverticulitis as she is nontoxic-appearing, vital signs reassuring no leukocytosis present.  Low suspicion for appendicitis as she has no right lower quadrant tenderness, vital signs reassuring.  I have low suspicion for intra-abdominal infection as she has low risk factors, vital signs reassuring, no leukocytosis, will defer imaging at this time.     Plan-   Stomach pain-likely secondary due to age pylori, will place him on triple therapy, follow-up with PCP for further evaluation. Headache-likely cluster headache, will recommend over-the-counter pain medications, follow-up PCP for further evaluation.   Vital signs have remained stable, no indication for hospital admission.   Patient given at home care as well strict return precautions.  Patient verbalized that they understood agreed to said plan.  States today that he has been having generalized headaches and weight loss, believes that it is because he is limiting the foods that he is eating and is continuing to have abdominal pain.  Reports that he did start taking the antibiotic regimen, but does state that it is making it difficult for him to eat.      Due to language barrier, an interpreter was present during the history-taking and subsequent discussion (and for part of the physical exam) with this patient.    Past Medical History:  Diagnosis Date   H. pylori infection     History reviewed. No pertinent surgical history.  History reviewed. No pertinent family history.  Social History   Socioeconomic History   Marital status: Single    Spouse name: Not on file    Number of children: Not on file   Years of education: Not on file   Highest education level: Not on file  Occupational History   Occupation: unemployed  Tobacco Use   Smoking status: Never   Smokeless tobacco: Never  Vaping Use   Vaping Use: Never used  Substance and Sexual Activity   Alcohol use: Never   Drug use: Never   Sexual activity: Not on file  Other Topics Concern   Not on file  Social History Narrative   Not on file   Social Determinants of Health   Financial Resource Strain: Not on file  Food Insecurity: Not on file  Transportation Needs: Not on file  Physical Activity: Not on file  Stress: Not on file  Social Connections: Not on file  Intimate Partner Violence: Not on file    ROS Review of Systems  Constitutional:  Negative for chills and fever.  HENT: Negative.    Eyes: Negative.   Respiratory:  Negative for shortness of breath.   Cardiovascular:  Negative for chest pain.  Gastrointestinal:  Positive for abdominal pain. Negative for nausea and vomiting.  Endocrine: Negative.   Genitourinary: Negative.   Musculoskeletal: Negative.   Skin: Negative.   Allergic/Immunologic: Negative.   Neurological: Negative.   Hematological: Negative.   Psychiatric/Behavioral: Negative.     Objective:   Today's Vitals: BP 126/83 (BP Location: Left Arm, Patient Position: Sitting, Cuff Size: Normal)   Pulse 77   Temp 98.5 F (36.9 C) (Oral)   Resp 18   Ht $R'5\' 7"'TO$  (1.702 m)   Wt 124 lb (56.2 kg)   SpO2 97%   BMI 19.42 kg/m   Physical Exam Vitals and nursing note reviewed.  Constitutional:      Appearance: Normal appearance.  HENT:     Head: Normocephalic and atraumatic.     Right Ear: External ear normal.     Left Ear: External ear normal.     Nose: Nose normal.     Mouth/Throat:     Mouth: Mucous membranes are moist.     Pharynx: Oropharynx is clear.  Eyes:     Extraocular Movements: Extraocular movements intact.     Conjunctiva/sclera: Conjunctivae  normal.     Pupils: Pupils are equal, round, and reactive to light.  Cardiovascular:     Rate and Rhythm: Normal rate and regular rhythm.     Pulses: Normal pulses.     Heart sounds: Normal heart sounds.  Pulmonary:     Effort: Pulmonary effort is normal.     Breath sounds: Normal breath sounds.  Abdominal:     Tenderness: There is abdominal tenderness in the epigastric area.  Musculoskeletal:        General: Normal range of motion.     Cervical back: Normal range of motion and neck supple.  Skin:    General: Skin is warm and dry.  Neurological:     General: No focal deficit present.     Mental Status: He is alert and oriented to person, place, and time.  Psychiatric:        Mood and Affect: Mood normal.        Behavior: Behavior normal.        Thought Content: Thought content normal.        Judgment: Judgment normal.    Assessment & Plan:   Problem List Items Addressed This Visit       Other   Helicobacter pylori stool test positive - Primary   Relevant Medications   omeprazole (PRILOSEC) 20 MG capsule   Other Visit Diagnoses     Body mass index (BMI) of 19.0 to 19.9 in adult           Outpatient Encounter Medications as of 10/17/2021  Medication Sig   amoxicillin (AMOXIL) 500 MG capsule Take 2 capsules (1,000 mg total) by mouth 2 (two) times daily for 14 days.   clarithromycin (BIAXIN) 500 MG tablet Take 1 tablet (500 mg total) by mouth 2 (two) times daily for 14 days.   escitalopram (LEXAPRO) 10 MG tablet Take 1 tablet (10 mg total) by mouth daily. With supper for stress   nystatin cream (MYCOSTATIN) Apply to affected area 2 times daily x 7 days for fungal infection   omeprazole (PRILOSEC) 20 MG capsule Take 1 capsule (20 mg total) by mouth 2 (two) times daily before a meal.   [DISCONTINUED] doxycycline (VIBRAMYCIN) 100 MG capsule Take 1 capsule (100 mg total) by mouth 2 (two) times daily. For abscess   [DISCONTINUED] omeprazole (PRILOSEC) 20 MG capsule Take  1  capsule (20 mg total) by mouth daily.   No facility-administered encounter medications on file as of 10/17/2021.   1. H. pylori infection Increase Prilosec to twice daily.  Patient encouraged to continue antibiotic regimen previously prescribed.  Patient education given on timing of medication to help tolerability.  Patient encouraged to increase hydration and caloric intake.  Time spent with patient reviewing gentle diet.  Red flags given for prompt reevaluation.  Patient given [facility] - omeprazole (PRILOSEC) 20 MG capsule; Take 1 capsule (20 mg total) by mouth 2 (two) times daily before a meal.  Dispense: 60 capsule; Refill: 1  2. Body mass index (BMI) of 19.0 to 19.9 in adult    I have reviewed the patient's medical history (PMH, PSH, Social History, Family History, Medications, and allergies) , and have been updated if relevant. I spent 30 minutes reviewing chart and  face to face time with patient.    Follow-up: Return in about 4 weeks (around 11/14/2021) for To establish PCP, At [facility] (needs PCP here) .   Loraine Grip Mayers, PA-C

## 2021-10-17 NOTE — Progress Notes (Signed)
Patient is eating minimally due to following a diet after being Dx with H Pylori, patient is taking medication. Patient reports intermittent pain in the forehead.

## 2021-10-23 ENCOUNTER — Ambulatory Visit: Payer: Self-pay

## 2021-10-23 NOTE — Telephone Encounter (Signed)
Wyatt Mage, case Production designer, theatre/television/film, with [employer], reports pt. Has had nausea since starting new medications - amoxicillin, clarithromycin, and omeprazole. Also "heart burn". Only eating one meal per day due to nausea. Is drinking fluids well. Please advise Tabitha.    Answer Assessment - Initial Assessment Questions 1. NAUSEA SEVERITY: "How bad is the nausea?" (e.g., mild, moderate, severe; dehydration, weight loss)   - MILD: loss of appetite without change in eating habits   - MODERATE: decreased oral intake without significant weight loss, dehydration, or malnutrition   - SEVERE: inadequate caloric or fluid intake, significant weight loss, symptoms of dehydration     Moderate 2. ONSET: "When did the nausea begin?"     Last week 3. VOMITING: "Any vomiting?" If Yes, ask: "How many times today?"     No 4. RECURRENT SYMPTOM: "Have you had nausea before?" If Yes, ask: "When was the last time?" "What happened that time?"     No 5. CAUSE: "What do you think is causing the nausea?"     Maybe new medications 6. PREGNANCY: "Is there any chance you are pregnant?" (e.g., unprotected intercourse, missed birth control pill, broken condom)     N/a  Protocols used: Nausea-A-AH

## 2021-10-24 ENCOUNTER — Other Ambulatory Visit: Payer: Self-pay | Admitting: Physician Assistant

## 2021-10-24 ENCOUNTER — Other Ambulatory Visit (HOSPITAL_COMMUNITY): Payer: Self-pay

## 2021-10-24 DIAGNOSIS — R110 Nausea: Secondary | ICD-10-CM

## 2021-10-24 MED ORDER — ONDANSETRON 8 MG PO TBDP
8.0000 mg | ORAL_TABLET | Freq: Three times a day (TID) | ORAL | 0 refills | Status: DC | PRN
  Filled 2021-10-24: qty 20, 7d supply, fill #0

## 2021-10-24 MED ORDER — ONDANSETRON 8 MG PO TBDP: 8.0000 mg | ORAL_TABLET | Freq: Three times a day (TID) | ORAL | 0 refills | Status: DC | PRN

## 2021-10-24 NOTE — Telephone Encounter (Signed)
Called pt made aware aware of PA below message. Verbalized understanding

## 2021-10-24 NOTE — Progress Notes (Signed)
Zofran for nausea prescribed   Roney Jaffe, PA-C Physician Assistant [facility] Medicine [email or web address]

## 2021-10-24 NOTE — Congregational Nurse Program (Signed)
  Dept: [phone]   Congregational Nurse Program Note  Date of Encounter: 10/24/2021  Past Medical History: Past Medical History:  Diagnosis Date   H. pylori infection     Encounter Details:  CNP Questionnaire - 10/24/21 1158       Questionnaire   Do you give verbal consent to treat you today? Yes    Location Patient Served  [facility]    Visit Setting Church or Organization    Patient Status Refugee    Insurance Uninsured ([employer]/Self-Pay)    Insurance Referral N/A    Medication Have Medication Insecurities;Provided Medication Assistance;Referred to Medication Assistance    Medical Provider Yes    Screening Referrals N/A    Medical Referral N/A    Medical Appointment Made N/A    Food N/A    Transportation N/A    Housing/Utilities N/A    Interpersonal Safety N/A    Intervention Blood pressure;Advocate;Navigate Healthcare System;Case Management;Counsel;Educate;Support    ED Visit Averted Yes    Life-Saving Intervention Made N/A            Patient unable to fill Zofran at [facility] due to financial issues. Requested provider to reroute to [facility]. I will pick up medication and deliver to patient residence.   Nicole Cella Ousmane Seeman RN BSn PCCN  [facility] [phone]-cell [phone]-office

## 2021-10-24 NOTE — Congregational Nurse Program (Signed)
  Dept: [phone]   Congregational Nurse Program Note  Date of Encounter: 10/24/2021  Past Medical History: Past Medical History:  Diagnosis Date   H. pylori infection     Encounter Details:   Medication picked up from [facility] and delivered to patient residence.   Nicole Cella Katarina Riebe RN BSn PCCN  [facility] [phone]-cell [phone]-office

## 2021-10-31 ENCOUNTER — Ambulatory Visit: Payer: Medicaid Other | Admitting: Physician Assistant

## 2021-10-31 ENCOUNTER — Other Ambulatory Visit (HOSPITAL_COMMUNITY): Payer: Self-pay

## 2021-10-31 ENCOUNTER — Other Ambulatory Visit: Payer: Self-pay

## 2021-10-31 VITALS — BP 121/77 | HR 84 | Temp 98.2°F | Ht 69.0 in | Wt 127.0 lb

## 2021-10-31 DIAGNOSIS — K219 Gastro-esophageal reflux disease without esophagitis: Secondary | ICD-10-CM

## 2021-10-31 DIAGNOSIS — Z8619 Personal history of other infectious and parasitic diseases: Secondary | ICD-10-CM | POA: Insufficient documentation

## 2021-10-31 MED ORDER — OMEPRAZOLE 20 MG PO CPDR
20.0000 mg | DELAYED_RELEASE_CAPSULE | Freq: Two times a day (BID) | ORAL | 1 refills | Status: DC
  Filled 2021-10-31: qty 60, 30d supply, fill #0

## 2021-10-31 NOTE — Progress Notes (Signed)
Established Patient Office Visit  Subjective:  Patient ID: Peter Snow, male    DOB: [birth year]  Age: 25 y.o. MRN: [id number]  CC:  Chief Complaint  Patient presents with   h pylori follow up      HPI [name] reports that he did finish the antibiotic regimen for H. pylori.  Reports that he continues to take the omeprazole twice daily.  Reports that he does feel much better, states that he is eating and drinking more.  Reports that his headaches have resolved.  No new concerns at this time  Due to language barrier, an interpreter was present during the history-taking and subsequent discussion (and for part of the physical exam) with this patient.   Past Medical History:  Diagnosis Date   H. pylori infection     History reviewed. No pertinent surgical history.  History reviewed. No pertinent family history.  Social History   Socioeconomic History   Marital status: Single    Spouse name: Not on file   Number of children: Not on file   Years of education: Not on file   Highest education level: Not on file  Occupational History   Occupation: unemployed  Tobacco Use   Smoking status: Never   Smokeless tobacco: Never  Vaping Use   Vaping Use: Never used  Substance and Sexual Activity   Alcohol use: Never   Drug use: Never   Sexual activity: Not on file  Other Topics Concern   Not on file  Social History Narrative   Not on file   Social Determinants of Health   Financial Resource Strain: Not on file  Food Insecurity: Not on file  Transportation Needs: Not on file  Physical Activity: Not on file  Stress: Not on file  Social Connections: Not on file  Intimate Partner Violence: Not on file    Outpatient Medications Prior to Visit  Medication Sig Dispense Refill   nystatin cream (MYCOSTATIN) Apply to affected area 2 times daily x 7 days for fungal infection 30 g 0   ondansetron (ZOFRAN-ODT) 8 MG disintegrating tablet Take 1 tablet (8 mg total)  by mouth every 8 (eight) hours as needed for nausea or vomiting. 20 tablet 0   escitalopram (LEXAPRO) 10 MG tablet Take 1 tablet (10 mg total) by mouth daily. With supper for stress 30 tablet 1   omeprazole (PRILOSEC) 20 MG capsule Take 1 capsule (20 mg total) by mouth 2 (two) times daily before a meal. 60 capsule 1   No facility-administered medications prior to visit.    No Known Allergies  ROS Review of Systems  Constitutional: Negative.   HENT: Negative.    Eyes: Negative.   Respiratory:  Negative for shortness of breath.   Gastrointestinal:  Negative for abdominal pain, nausea and vomiting.  Endocrine: Negative.   Genitourinary: Negative.   Musculoskeletal: Negative.   Skin: Negative.   Allergic/Immunologic: Negative.   Neurological:  Negative for headaches.  Hematological: Negative.   Psychiatric/Behavioral: Negative.       Objective:    Physical Exam Vitals and nursing note reviewed.  Constitutional:      Appearance: Normal appearance.  HENT:     Head: Normocephalic and atraumatic.     Right Ear: External ear normal.     Left Ear: External ear normal.     Nose: Nose normal.     Mouth/Throat:     Mouth: Mucous membranes are moist.     Pharynx: Oropharynx is clear.  Eyes:  Extraocular Movements: Extraocular movements intact.     Conjunctiva/sclera: Conjunctivae normal.     Pupils: Pupils are equal, round, and reactive to light.  Cardiovascular:     Rate and Rhythm: Normal rate and regular rhythm.     Pulses: Normal pulses.     Heart sounds: Normal heart sounds.  Pulmonary:     Effort: Pulmonary effort is normal.     Breath sounds: Normal breath sounds.  Abdominal:     General: Abdomen is flat.     Palpations: Abdomen is soft.     Tenderness: There is no abdominal tenderness.  Musculoskeletal:        General: Normal range of motion.     Cervical back: Normal range of motion and neck supple.  Skin:    General: Skin is warm and dry.  Neurological:      General: No focal deficit present.     Mental Status: He is alert and oriented to person, place, and time.  Psychiatric:        Mood and Affect: Mood normal.        Behavior: Behavior normal.        Thought Content: Thought content normal.        Judgment: Judgment normal.    BP 121/77 (BP Location: Left Arm, Patient Position: Sitting, Cuff Size: Normal)    Pulse 84    Temp 98.2 F (36.8 C) (Oral)    Ht 5\' 9"  (1.753 m)    Wt 127 lb (57.6 kg)    SpO2 99%    BMI 18.75 kg/m  Wt Readings from Last 3 Encounters:  10/31/21 127 lb (57.6 kg)  10/17/21 124 lb (56.2 kg)  09/01/21 122 lb 9.2 oz (55.6 kg)     Health Maintenance Due  Topic Date Due   HPV VACCINES (1 - Male 2-dose series) Never done   HIV Screening  Never done   Hepatitis C Screening  Never done   TETANUS/TDAP  Never done   INFLUENZA VACCINE  Never done       Topic Date Due   HPV VACCINES (1 - Male 2-dose series) Never done    Lab Results  Component Value Date   TSH 4.340 11/15/2020   Lab Results  Component Value Date   WBC 6.7 10/15/2021   HGB 14.4 10/15/2021   HCT 43.1 10/15/2021   MCV 83.4 10/15/2021   PLT 243 10/15/2021   Lab Results  Component Value Date   NA 139 10/15/2021   K 4.0 10/15/2021   CO2 30 10/15/2021   GLUCOSE 91 10/15/2021   BUN 10 10/15/2021   CREATININE 0.68 10/15/2021   BILITOT 1.6 (H) 10/15/2021   ALKPHOS 66 10/15/2021   AST 16 10/15/2021   ALT 22 10/15/2021   PROT 7.0 10/15/2021   ALBUMIN 4.7 10/15/2021   CALCIUM 9.9 10/15/2021   ANIONGAP 6 10/15/2021   Lab Results  Component Value Date   CHOL 104 11/15/2020   Lab Results  Component Value Date   HDL 46 11/15/2020   Lab Results  Component Value Date   LDLCALC 42 11/15/2020   Lab Results  Component Value Date   TRIG 77 11/15/2020   No results found for: [facility] Lab Results  Component Value Date   HGBA1C 5.5 11/15/2020      Assessment & Plan:   Problem List Items Addressed This Visit       Digestive    Gastroesophageal reflux disease without esophagitis   Relevant Medications  omeprazole (PRILOSEC) 20 MG capsule     Other   History of Helicobacter pylori infection - Primary    Meds ordered this encounter  Medications   omeprazole (PRILOSEC) 20 MG capsule    Sig: Take 1 capsule (20 mg total) by mouth 2 (two) times daily before a meal.    Dispense:  60 capsule    Refill:  1    Congregational Nurse [employer] Specific Question:   Supervising Provider    Answer:   Delford Field, PATRICK E [[id number]]   1. History of Helicobacter pylori infection Patient encouraged to continue Prilosec twice daily for the next 2 weeks, then consider tapering to once daily.  Patient has upcoming appointment to [facility]  Red flags given for prompt reevaluation.  2. Gastroesophageal reflux disease without esophagitis  - omeprazole (PRILOSEC) 20 MG capsule; Take 1 capsule (20 mg total) by mouth 2 (two) times daily before a meal.  Dispense: 60 capsule; Refill: 1   I have reviewed the patient's medical history (PMH, PSH, Social History, Family History, Medications, and allergies) , and have been updated if relevant. I spent 20 minutes reviewing chart and  face to face time with patient.   Follow-up: Return in about 15 days (around 11/15/2021) for To establish PCP, At [facility].    Kasandra Knudsen Mayers, PA-C

## 2021-10-31 NOTE — Patient Instructions (Signed)
I encourage you to continue taking the omeprazole twice a day for at least the next 2 weeks, you can then try taking it just once a day in the morning to see if that is sufficient.  Please let us know if there is anything else we can do for you  Roney Jaffe, PA-C Physician Assistant [facility] [email or web address]

## 2021-10-31 NOTE — Congregational Nurse Program (Signed)
°  Dept: [phone]   Congregational Nurse Program Note  Date of Encounter: 10/31/2021  Past Medical History: Past Medical History:  Diagnosis Date   H. pylori infection     Encounter Details: patient is requesting medication refill for Prilosec. Referred to [facility] bus. Patient uninsured, please send medication to [facility] for medication assistance under [facility] Congregational Nurse Program.  Nicole Cella Chundra Sauerwein RN BSn PCCN  [facility] [phone]-cell [phone]-office

## 2021-11-02 ENCOUNTER — Other Ambulatory Visit (HOSPITAL_COMMUNITY): Payer: Self-pay

## 2021-11-15 ENCOUNTER — Other Ambulatory Visit: Payer: Self-pay

## 2021-11-15 ENCOUNTER — Ambulatory Visit: Payer: Self-pay | Attending: Family Medicine | Admitting: Family Medicine

## 2021-11-15 VITALS — BP 116/80 | HR 88 | Wt 128.2 lb

## 2021-11-15 DIAGNOSIS — K5909 Other constipation: Secondary | ICD-10-CM

## 2021-11-15 DIAGNOSIS — G4709 Other insomnia: Secondary | ICD-10-CM

## 2021-11-15 DIAGNOSIS — Z1159 Encounter for screening for other viral diseases: Secondary | ICD-10-CM

## 2021-11-15 MED ORDER — HYDROXYZINE HCL 25 MG PO TABS
25.0000 mg | ORAL_TABLET | Freq: Every evening | ORAL | 1 refills | Status: DC | PRN
  Filled 2021-11-15: qty 30, 30d supply, fill #0

## 2021-11-15 MED ORDER — NYSTATIN 100000 UNIT/GM EX CREA
TOPICAL_CREAM | CUTANEOUS | 0 refills | Status: DC
Start: 2021-11-15 — End: 2023-03-19
  Filled 2021-11-15: qty 30, 7d supply, fill #0

## 2021-11-15 MED ORDER — POLYETHYLENE GLYCOL 3350 17 GM/SCOOP PO POWD
17.0000 g | Freq: Every day | ORAL | 1 refills | Status: DC
Start: 2021-11-15 — End: 2023-03-19
  Filled 2021-11-15: qty 510, 30d supply, fill #0

## 2021-11-15 NOTE — Patient Instructions (Signed)

## 2021-11-15 NOTE — Progress Notes (Signed)
Subjective:  Patient ID: Peter Snow, male    DOB: August 01, 1996  Age: 25 y.o. MRN: [id number]  CC: Abdominal Pain   HPI Peter Snow is a 25 y.o. year old male who presents today to establish care.  Interval History: He complains of insomnia x1 week.  He previously slept 9pm to 9am but now he goes to bed late, wakes up at 2am and wakes up feeling tired and feeling he has 'done so much work'. His sleep is not restful and he states 'he is anxious in his sleep'.Symptoms have been present  Denies caffeine intake. He denies any recent stress but endorses being treated for H.pylori bacteria 2 weeks ago. He states he still feels acid in his mouth and endorses being constipated.  Past Medical History:  Diagnosis Date   H. pylori infection     No past surgical history on file.  No family history on file.  No Known Allergies  Outpatient Medications Prior to Visit  Medication Sig Dispense Refill   omeprazole (PRILOSEC) 20 MG capsule Take 1 capsule (20 mg total) by mouth 2 (two) times daily before a meal. 60 capsule 1   ondansetron (ZOFRAN-ODT) 8 MG disintegrating tablet Take 1 tablet (8 mg total) by mouth every 8 (eight) hours as needed for nausea or vomiting. (Patient not taking: Reported on 11/15/2021) 20 tablet 0   nystatin cream (MYCOSTATIN) Apply to affected area 2 times daily x 7 days for fungal infection (Patient not taking: Reported on 11/15/2021) 30 g 0   No facility-administered medications prior to visit.     ROS Review of Systems  Constitutional:  Negative for activity change and appetite change.  HENT:  Negative for sinus pressure and sore throat.   Eyes:  Negative for visual disturbance.  Respiratory:  Negative for cough, chest tightness and shortness of breath.   Cardiovascular:  Negative for chest pain and leg swelling.  Gastrointestinal:  Positive for constipation. Negative for abdominal distention, abdominal pain and diarrhea.  Endocrine: Negative.    Genitourinary:  Negative for dysuria.  Musculoskeletal:  Negative for joint swelling and myalgias.  Skin:  Negative for rash.  Allergic/Immunologic: Negative.   Neurological:  Negative for weakness, light-headedness and numbness.  Psychiatric/Behavioral:  Positive for sleep disturbance. Negative for dysphoric mood and suicidal ideas.    Objective:  BP 116/80    Pulse 88    Wt 128 lb 3.2 oz (58.2 kg)    SpO2 99%    BMI 18.93 kg/m   BP/Weight 11/15/2021 10/31/2021 10/24/2021  Systolic BP 116 121 110  Diastolic BP 80 77 71  Wt. (Lbs) 128.2 127 -  BMI 18.93 18.75 -      Physical Exam Constitutional:      Appearance: He is well-developed.  Cardiovascular:     Rate and Rhythm: Normal rate.     Heart sounds: Normal heart sounds. No murmur heard. Pulmonary:     Effort: Pulmonary effort is normal.     Breath sounds: Normal breath sounds. No wheezing or rales.  Chest:     Chest wall: No tenderness.  Abdominal:     General: Bowel sounds are normal. There is no distension.     Palpations: Abdomen is soft. There is no mass.     Tenderness: There is no abdominal tenderness.  Musculoskeletal:        General: Normal range of motion.     Right lower leg: No edema.     Left lower leg: No edema.  Neurological:  Mental Status: He is alert and oriented to person, place, and time.  Psychiatric:        Mood and Affect: Mood normal.    CMP Latest Ref Rng & Units 10/15/2021 09/01/2021 11/15/2020  Glucose 70 - 99 mg/dL 91 90 94  BUN 6 - 20 mg/dL 10 14 15   Creatinine 0.61 - 1.24 mg/dL 0.68 0.96 0.81  Sodium 135 - 145 mmol/L 139 141 140  Potassium 3.5 - 5.1 mmol/L 4.0 4.2 4.7  Chloride 98 - 111 mmol/L 103 104 103  CO2 22 - 32 mmol/L 30 31 24   Calcium 8.9 - 10.3 mg/dL 9.9 9.5 9.9  Total Protein 6.5 - 8.1 g/dL 7.0 - 7.3  Total Bilirubin 0.3 - 1.2 mg/dL 1.6(H) - 0.5  Alkaline Phos 38 - 126 U/L 66 - 90  AST 15 - 41 U/L 16 - 26  ALT 0 - 44 U/L 22 - 28    Lipid Panel     Component  Value Date/Time   CHOL 104 11/15/2020 0950   TRIG 77 11/15/2020 0950   HDL 46 11/15/2020 0950   LDLCALC 42 11/15/2020 0950    CBC    Component Value Date/Time   WBC 6.7 10/15/2021 2116   RBC 5.17 10/15/2021 2116   HGB 14.4 10/15/2021 2116   HGB 16.2 11/15/2020 0950   HCT 43.1 10/15/2021 2116   HCT 47.6 11/15/2020 0950   PLT 243 10/15/2021 2116   PLT 304 11/15/2020 0950   MCV 83.4 10/15/2021 2116   MCV 85 11/15/2020 0950   MCH 27.9 10/15/2021 2116   MCHC 33.4 10/15/2021 2116   RDW 12.9 10/15/2021 2116   RDW 12.3 11/15/2020 0950   LYMPHSABS 3.6 (H) 11/15/2020 0950   EOSABS 0.3 11/15/2020 0950   BASOSABS 0.0 11/15/2020 0950    Lab Results  Component Value Date   HGBA1C 5.5 11/15/2020    Assessment & Plan:  1. Other insomnia Symptoms have been present for the last 1 week Anxiety likely playing a role Discussed the role of sleep hygiene Trial of hydroxyzine - hydrOXYzine (ATARAX) 25 MG tablet; Take 1 tablet (25 mg total) by mouth at bedtime as needed (insomnia).  Dispense: 30 tablet; Refill: 1  2. Other constipation Counseled to increase fiber intake Will place on MiraLAX - polyethylene glycol powder (GLYCOLAX/MIRALAX) 17 GM/SCOOP powder; Take 17 g by mouth daily.  Dispense: 3350 g; Refill: 1  3. Screening for viral disease - HCV Ab w Reflex to Quant PCR - HIV Antibody (routine testing w rflx)    Meds ordered this encounter  Medications   polyethylene glycol powder (GLYCOLAX/MIRALAX) 17 GM/SCOOP powder    Sig: Take 17 g by mouth daily.    Dispense:  3350 g    Refill:  1   hydrOXYzine (ATARAX) 25 MG tablet    Sig: Take 1 tablet (25 mg total) by mouth at bedtime as needed (insomnia).    Dispense:  30 tablet    Refill:  1   nystatin cream (MYCOSTATIN)    Sig: Apply to affected area 2 times daily x 7 days for fungal infection    Dispense:  30 g    Refill:  0    Follow-up: Return in about 3 months (around 02/13/2022) for Chronic medical conditions.        Charlott Rakes, MD, FAAFP. [facility] and [facility], [place]   11/15/2021, 5:51 PM

## 2021-11-15 NOTE — Progress Notes (Signed)
Abdominal pain Not sleeping.

## 2021-11-16 LAB — HCV INTERPRETATION

## 2021-11-16 LAB — HIV ANTIBODY (ROUTINE TESTING W REFLEX): HIV Screen 4th Generation wRfx: NONREACTIVE

## 2021-11-16 LAB — HCV AB W REFLEX TO QUANT PCR: HCV Ab: <0.1 s/co ratio (ref 0.0–0.9)

## 2021-11-17 ENCOUNTER — Telehealth: Payer: Self-pay

## 2021-11-17 NOTE — Telephone Encounter (Signed)
Patient name and DOB has been verified Patient was informed of lab results. Patient had no questions.  

## 2021-11-17 NOTE — Telephone Encounter (Signed)
-----   Message from Hoy Register, MD sent at 11/16/2021 12:54 PM EST ----- Please inform the patient that labs are normal. Thank you.

## 2021-11-30 ENCOUNTER — Other Ambulatory Visit: Payer: Self-pay

## 2021-11-30 ENCOUNTER — Emergency Department (HOSPITAL_BASED_OUTPATIENT_CLINIC_OR_DEPARTMENT_OTHER)
Admission: EM | Admit: 2021-11-30 | Discharge: 2021-11-30 | Disposition: A | Discharge status: Home / Self Care | Payer: Self-pay | Attending: Emergency Medicine | Admitting: Emergency Medicine

## 2021-11-30 ENCOUNTER — Encounter (HOSPITAL_BASED_OUTPATIENT_CLINIC_OR_DEPARTMENT_OTHER): Payer: Self-pay | Admitting: *Deleted

## 2021-11-30 ENCOUNTER — Emergency Department (HOSPITAL_COMMUNITY)
Admission: EM | Admit: 2021-11-30 | Discharge: 2021-11-30 | Disposition: A | Discharge status: Home / Self Care | Payer: Self-pay | Attending: Emergency Medicine | Admitting: Emergency Medicine

## 2021-11-30 DIAGNOSIS — R002 Palpitations: Secondary | ICD-10-CM | POA: Insufficient documentation

## 2021-11-30 DIAGNOSIS — Z5321 Procedure and treatment not carried out due to patient leaving prior to being seen by health care provider: Secondary | ICD-10-CM | POA: Insufficient documentation

## 2021-11-30 DIAGNOSIS — K219 Gastro-esophageal reflux disease without esophagitis: Secondary | ICD-10-CM

## 2021-11-30 DIAGNOSIS — L299 Pruritus, unspecified: Secondary | ICD-10-CM | POA: Insufficient documentation

## 2021-11-30 DIAGNOSIS — R109 Unspecified abdominal pain: Secondary | ICD-10-CM | POA: Insufficient documentation

## 2021-11-30 DIAGNOSIS — R000 Tachycardia, unspecified: Secondary | ICD-10-CM | POA: Insufficient documentation

## 2021-11-30 LAB — CBC WITH DIFFERENTIAL/PLATELET
Abs Immature Granulocytes: 0.02 10*3/uL (ref 0.00–0.07)
Basophils Absolute: 0.1 10*3/uL (ref 0.0–0.1)
Basophils Relative: 1 %
Eosinophils Absolute: 0.3 10*3/uL (ref 0.0–0.5)
Eosinophils Relative: 3 %
HCT: 42.5 % (ref 39.0–52.0)
Hemoglobin: 14.4 g/dL (ref 13.0–17.0)
Immature Granulocytes: 0 %
Lymphocytes Relative: 43 %
Lymphs Abs: 3.8 10*3/uL (ref 0.7–4.0)
MCH: 28.3 pg (ref 26.0–34.0)
MCHC: 33.9 g/dL (ref 30.0–36.0)
MCV: 83.5 fL (ref 80.0–100.0)
Monocytes Absolute: 0.8 10*3/uL (ref 0.1–1.0)
Monocytes Relative: 10 %
Neutro Abs: 3.8 10*3/uL (ref 1.7–7.7)
Neutrophils Relative %: 43 %
Platelets: 281 10*3/uL (ref 150–400)
RBC: 5.09 MIL/uL (ref 4.22–5.81)
RDW: 12.4 % (ref 11.5–15.5)
WBC: 8.8 10*3/uL (ref 4.0–10.5)
nRBC: 0 % (ref 0.0–0.2)

## 2021-11-30 LAB — COMPREHENSIVE METABOLIC PANEL
ALT: 16 U/L (ref 0–44)
AST: 22 U/L (ref 15–41)
Albumin: 4.4 g/dL (ref 3.5–5.0)
Alkaline Phosphatase: 63 U/L (ref 38–126)
Anion gap: 10 (ref 5–15)
BUN: 8 mg/dL (ref 6–20)
CO2: 26 mmol/L (ref 22–32)
Calcium: 9.3 mg/dL (ref 8.9–10.3)
Chloride: 102 mmol/L (ref 98–111)
Creatinine, Ser: 0.72 mg/dL (ref 0.61–1.24)
GFR, Estimated: >60 mL/min (ref >60)
Glucose, Bld: 115 mg/dL — ABNORMAL HIGH (ref 70–99)
Potassium: 3.4 mmol/L — ABNORMAL LOW (ref 3.5–5.1)
Sodium: 138 mmol/L (ref 135–145)
Total Bilirubin: 0.9 mg/dL (ref 0.3–1.2)
Total Protein: 6.6 g/dL (ref 6.5–8.1)

## 2021-11-30 LAB — MAGNESIUM: Magnesium: 1.8 mg/dL (ref 1.7–2.4)

## 2021-11-30 LAB — URINALYSIS, ROUTINE W REFLEX MICROSCOPIC
Bilirubin Urine: NEGATIVE
Glucose, UA: NEGATIVE mg/dL
Hgb urine dipstick: NEGATIVE
Ketones, ur: NEGATIVE mg/dL
Leukocytes,Ua: NEGATIVE
Nitrite: NEGATIVE
Protein, ur: NEGATIVE mg/dL
Specific Gravity, Urine: 1.02 (ref 1.005–1.030)
pH: 6.5 (ref 5.0–8.0)

## 2021-11-30 LAB — LIPASE, BLOOD: Lipase: 36 U/L (ref 11–51)

## 2021-11-30 LAB — TSH: TSH: 4.158 u[IU]/mL (ref 0.350–4.500)

## 2021-11-30 MED ORDER — HYDROXYZINE HCL 25 MG PO TABS
25.0000 mg | ORAL_TABLET | Freq: Once | ORAL | Status: AC
  Administered 2021-11-30: 25 mg via ORAL
  Filled 2021-11-30: qty 1

## 2021-11-30 MED ORDER — METOPROLOL TARTRATE 25 MG PO TABS
25.0000 mg | ORAL_TABLET | Freq: Once | ORAL | Status: AC
  Administered 2021-11-30: 25 mg via ORAL
  Filled 2021-11-30: qty 1

## 2021-11-30 MED ORDER — METOPROLOL TARTRATE 25 MG PO TABS: 25.0000 mg | ORAL_TABLET | Freq: Two times a day (BID) | ORAL | 0 refills | Status: DC

## 2021-11-30 MED ORDER — OMEPRAZOLE 20 MG PO CPDR: 20.0000 mg | DELAYED_RELEASE_CAPSULE | Freq: Two times a day (BID) | ORAL | 1 refills | Status: DC

## 2021-11-30 MED ORDER — SODIUM CHLORIDE 0.9 % IV BOLUS
1000.0000 mL | Freq: Once | INTRAVENOUS | Status: AC
  Administered 2021-11-30: 1000 mL via INTRAVENOUS

## 2021-11-30 NOTE — ED Notes (Signed)
Triage RN jennifer notified of patient wanting to leave

## 2021-11-30 NOTE — Discharge Instructions (Signed)
Continue taking omeprazole as previously prescribed.  Begin taking metoprolol as prescribed today.  Follow-up with cardiology in the next week.  Their contact information has been provided in this discharge summary for you to call and make these arrangements.

## 2021-11-30 NOTE — ED Triage Notes (Signed)
Pt called RN staff back into room, states that he can feel his heart racing, EKG reflects increase of rate from 95bpm to 128bp ST. EKG repeated and given to EDP. EDP to bedside.

## 2021-11-30 NOTE — ED Provider Notes (Signed)
[facility] EMERGENCY DEPT Provider Note   CSN: [id number] Arrival date & time: 11/30/21  0421     History  Chief Complaint  Patient presents with   Palpitations    Peter Snow is a 26 y.o. male.  Patient is a 26 year old male with past medical history of GERD and H. pylori positive.  Patient presenting today with complaints of palpitations.  He reports eating grapes this evening, then feeling "scratchy throughout his body" afterward.  He describes shortness of breath and tightness in his chest.  He initially presented to [facility] with the above complaints, but left without being seen due to extended wait times, then came here.  He denies diarrhea or vomiting.  He denies fevers or chills.  He denies any recent exertional symptoms.  The history is provided by the patient.  Palpitations Palpitations quality:  Regular Onset quality:  Sudden Duration:  3 hours Timing:  Intermittent Progression:  Waxing and waning Chronicity:  New Relieved by:  Nothing Worsened by:  Nothing Ineffective treatments:  None tried     Home Medications Prior to Admission medications   Medication Sig Start Date End Date Taking? Authorizing Provider  hydrOXYzine (ATARAX) 25 MG tablet Take 1 tablet (25 mg total) by mouth at bedtime as needed (insomnia). 11/15/21   Hoy Register, MD  nystatin cream (MYCOSTATIN) Apply to affected area 2 times daily x 7 days for fungal infection 11/15/21   Hoy Register, MD  omeprazole (PRILOSEC) 20 MG capsule Take 1 capsule (20 mg total) by mouth 2 (two) times daily before a meal. 10/31/21   Mayers, Cari S, PA-C  ondansetron (ZOFRAN-ODT) 8 MG disintegrating tablet Take 1 tablet (8 mg total) by mouth every 8 (eight) hours as needed for nausea or vomiting. Patient not taking: Reported on 11/15/2021 10/24/21   Mayers, Cari S, PA-C  polyethylene glycol powder (GLYCOLAX/MIRALAX) 17 GM/SCOOP powder Take 17 g by mouth daily. 11/15/21   Hoy Register, MD       Allergies    Patient has no known allergies.    Review of Systems   Review of Systems  Cardiovascular:  Positive for palpitations.  All other systems reviewed and are negative.  Physical Exam Updated Vital Signs BP (!) 158/99    Pulse 95    Temp 98.3 F (36.8 C) (Oral)    Resp 12    Ht 5\' 7"  (1.702 m)    Wt 58.1 kg    SpO2 100%    BMI 20.05 kg/m  Physical Exam Vitals and nursing note reviewed.  Constitutional:      General: He is not in acute distress.    Appearance: He is well-developed. He is not diaphoretic.  HENT:     Head: Normocephalic and atraumatic.  Cardiovascular:     Rate and Rhythm: Regular rhythm. Tachycardia present.     Heart sounds: No murmur heard.   No friction rub.  Pulmonary:     Effort: Pulmonary effort is normal. No respiratory distress.     Breath sounds: Normal breath sounds. No wheezing or rales.  Abdominal:     General: Bowel sounds are normal. There is no distension.     Palpations: Abdomen is soft.     Tenderness: There is no abdominal tenderness.  Musculoskeletal:        General: No swelling or tenderness. Normal range of motion.     Cervical back: Normal range of motion and neck supple.     Right lower leg: No edema.  Left lower leg: No edema.  Skin:    General: Skin is warm and dry.  Neurological:     Mental Status: He is alert and oriented to person, place, and time.     Coordination: Coordination normal.    ED Results / Procedures / Treatments   Labs (all labs ordered are listed, but only abnormal results are displayed) Labs Reviewed - No data to display  EKG None  Radiology No results found.  Procedures Procedures    Medications Ordered in ED Medications  sodium chloride 0.9 % bolus 1,000 mL (has no administration in time range)  hydrOXYzine (ATARAX) tablet 25 mg (has no administration in time range)    ED Course/ Medical Decision Making/ A&P  This patient presents to the ED for concern of palpitations,  this involves an extensive number of treatment options, and is a complaint that carries with it a high risk of complications and morbidity.  The differential diagnosis includes SVT, atrial fibrillation, anxiety, hyperthyroidism   Co morbidities that complicate the patient evaluation  None   Additional history obtained:  No additional history taken from other individuals and no outside records are necessary   Lab Tests:  I Ordered, and personally interpreted labs.  The pertinent results include: CBC, basic metabolic panel, troponin, TSH.  All of these are unremarkable   Imaging Studies ordered:  No imaging studies ordered or indicated   Cardiac Monitoring:  The patient was maintained on a cardiac monitor.  I personally viewed and interpreted the cardiac monitored which showed an underlying rhythm of: Sinus tachycardia/sinus rhythm   Medicines ordered and prescription drug management:  I ordered medication including hydroxyzine possible allergic reaction/anxiety as well as metoprolol for tachycardia  Reevaluation of the patient after these medicines showed that the patient improved I have reviewed the patients home medicines and have made adjustments as needed   Test Considered:  No other tests considered or indicated   Critical Interventions:  None   Consultations Obtained:  No consultations requested or indicated   Problem List / ED Course:  Patient is a 26 year old male presenting with complaints of palpitations.  This started earlier this evening after eating grapes and apples.  He originally presented to [facility] with these complaints, then came here after waiting an extended period of time. Patient arrives here with sinus tachycardia, but otherwise stable vital signs.  His work-up shows a normal CBC, electrolytes, TSH, and troponin.  Patient hydrated with normal saline and given hydroxyzine in case this was some sort of reaction to the fruit he consumed and  also was given an oral dose of metoprolol. At this point, I feel as though he can safely be discharged with outpatient follow-up.  Patient is concerned about his tachycardia and I will have him follow-up with cardiology to discuss the issue. I suspect there may be a component of anxiety as he does report having a stressful day prior to this episode.   Reevaluation:  After the interventions noted above, I reevaluated the patient and found that they have :improved   Social Determinants of Health:  Patient immigrated here within the past year from [place].  There is a slight language barrier, but communication was adequate.   Dispostion:  After consideration of the diagnostic results and the patients response to treatment, I feel that the patent would benefit from discharge to home with metoprolol and follow-up with cardiology as an outpatient.  I will start metoprolol.    Final Clinical Impression(s) / ED  Diagnoses Final diagnoses:  None    Rx / DC Orders ED Discharge Orders     None         Geoffery Lyonselo, Dayten Juba, MD 11/30/21 [phone]

## 2021-11-30 NOTE — ED Provider Triage Note (Signed)
Emergency Medicine Provider Triage Evaluation Note  Peter Snow , a 26 y.o. male  was evaluated in triage.  Pt complains of palpitations and epigastric discomfort.  Patient reports about 30 minutes prior to arrival he was eating grapes and garlic.  Then he started having some itching and feeling like his heart was racing.  He denies seeing any hives or rash.  No facial swelling.  He reports he also had some epigastric discomfort.  Itching and epigastric pain have improved but patient still has palpitations.  Denies chest pain or shortness of breath.  Review of Systems  Positive: Palpitations, itching, abdominal pain Negative: Fever, vomiting, chest pain, shortness of breath, rash, facial swelling  Physical Exam  BP (!) 156/87 (BP Location: Right Arm)    Pulse (!) 129    Temp 98.7 F (37.1 C) (Oral)    Resp (!) 25    SpO2 100%  Gen:   Awake, no distress   Resp:  Normal effort  Cardiac: Tachycardia with regular rhythm MSK:   Moves extremities without difficulty  Other:    Medical Decision Making  Medically screening exam initiated at 2:32 AM.  Appropriate orders placed.  Peter Snow was informed that the remainder of the evaluation will be completed by another provider, this initial triage assessment does not replace that evaluation, and the importance of remaining in the ED until their evaluation is complete.     Jacqlyn Larsen, [place] 11/30/21 [phone]

## 2021-11-30 NOTE — ED Notes (Signed)
Pt given urinal.

## 2021-11-30 NOTE — ED Triage Notes (Signed)
Patient coming from home c/o itching and heart palpitations after eating some grapes Patient also endorses right sided abd pain.

## 2021-11-30 NOTE — ED Notes (Signed)
Patient states he wants to leave because he was promised a room right away

## 2021-11-30 NOTE — ED Triage Notes (Signed)
Pt states he ate grapes and apples around 11pm last night and after eating he experienced some chest pain and sob

## 2021-12-04 ENCOUNTER — Ambulatory Visit (INDEPENDENT_AMBULATORY_CARE_PROVIDER_SITE_OTHER): Payer: Self-pay | Admitting: Cardiovascular Disease

## 2021-12-04 ENCOUNTER — Other Ambulatory Visit: Payer: Self-pay

## 2021-12-04 ENCOUNTER — Encounter: Payer: Self-pay | Admitting: Cardiovascular Disease

## 2021-12-04 ENCOUNTER — Other Ambulatory Visit: Payer: Self-pay | Admitting: *Deleted

## 2021-12-04 VITALS — BP 130/80 | HR 86 | Ht 67.0 in | Wt 125.4 lb

## 2021-12-04 DIAGNOSIS — R011 Cardiac murmur, unspecified: Secondary | ICD-10-CM

## 2021-12-04 DIAGNOSIS — R000 Tachycardia, unspecified: Secondary | ICD-10-CM | POA: Insufficient documentation

## 2021-12-04 MED ORDER — METOPROLOL TARTRATE 25 MG PO TABS
25.0000 mg | ORAL_TABLET | Freq: Two times a day (BID) | ORAL | 11 refills | Status: DC
  Filled 2021-12-04 – 2022-01-02 (×2): qty 60, 30d supply, fill #0
  Filled 2022-03-15: qty 60, 30d supply, fill #1

## 2021-12-04 MED ORDER — METOPROLOL TARTRATE 25 MG PO TABS: 25.0000 mg | ORAL_TABLET | Freq: Two times a day (BID) | ORAL | 11 refills | Status: DC

## 2021-12-04 NOTE — Progress Notes (Signed)
Cardiology Office Note:    Date:  12/04/2021   ID:  Peter Snow, DOB 06/30/1996, MRN [id number]  PCP:  Pcp, No   [facility] Providers Cardiologist:  Monike Bragdon   Referring MD: Geoffery Lyons, MD   Chief Complaint  Patient presents with   Tachycardia          History of Present Illness:    Peter Snow is a 26 y.o. male with a hx of gastroesophageal reflux disease and sinus tachycardia.   Seen with ipad interpreter, Nasir .   Also with in person interpreter . Raynelle Fanning   We are asked to see him today for further evaluation of his sinus tachycardia. Work-up including CBC, basic metabolic profile, TSH were all unremarkable.  He was started on metoprolol 25 mg twice a day.  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 ( Aug. 2021: Dr. Chest x-ray) clinic in this area and x-rays out of splint may be as active months after splinting.  Sorry intermittent chest x-ray is good everything but there was just confused about my stomach cramping because of which  [facility] I does not receive from them I will see you so but because 24 hours  Has H. Pylori.  Took abx, Took omeprazol  Has a poor appetite  + Wt loss  - does not know how much weight by exam .  Clothes are loose.   Had blood streaked sputum this am after waking up   No hx of TB ,   - CXR did not show any lung abnormalities Has likely allergy to grapes, ? Apples.  Causes his chest to itch.  Cough you need to be seen by the lung doctors that you keep coughing up blood that might be reactivation of TB did you know if you ever had TB   Past Medical History:  Diagnosis Date   GERD (gastroesophageal reflux disease)    H. pylori infection    Sinus tachycardia     History reviewed. No pertinent surgical  history.  Current Medications: Current Meds  Medication Sig   metoprolol tartrate (LOPRESSOR) 25 MG tablet Take 1 tablet (25 mg total) by mouth 2 (two) times daily.   omeprazole (PRILOSEC) 20 MG capsule Take 1 capsule (20 mg total) by mouth 2 (two) times daily before a meal.     Allergies:   Patient has no known allergies.   Social History   Socioeconomic History   Marital status: Single    Spouse name: Not on file   Number of children: Not on file   Years of education: Not on file   Highest education level: Not on file  Occupational History   Occupation: unemployed  Tobacco Use   Smoking status: Never   Smokeless tobacco: Never  Vaping Use   Vaping Use: Never used  Substance and Sexual Activity   Alcohol use: Never   Drug use: Never   Sexual activity: Not on file  Other Topics Concern   Not on file  Social History Narrative   Not on file   Social Determinants of Health   Financial Resource Strain: Not on file  Food Insecurity: Not on file  Transportation Needs: Not on file  Physical Activity: Not on file  Stress: Not on file  Social Connections: Not on file     Family History: The patient's family history is not on file.  ROS:   Please see the history of present illness.     All other systems reviewed and are negative.  EKGs/Labs/Other Studies Reviewed:    The following studies were reviewed today:    EKG:     Recent Labs: 11/30/2021: ALT 16; BUN 8; Creatinine, Ser 0.72; Hemoglobin 14.4; Magnesium 1.8; Platelets 281; Potassium 3.4; Sodium 138; TSH 4.158  Recent Lipid Panel    Component Value Date/Time   CHOL 104 11/15/2020 0950   TRIG 77 11/15/2020 0950   HDL 46 11/15/2020 0950   LDLCALC 42 11/15/2020 0950     Risk Assessment/Calculations:          Physical Exam:    VS:  BP 130/80 (BP Location: Right Arm, Patient Position: Sitting, Cuff Size: Normal)    Pulse 86    Ht 5\' 7"  (1.702 m)    Wt 125 lb 6.4 oz (56.9 kg)    SpO2 99%    BMI 19.64  kg/m     Wt Readings from Last 3 Encounters:  12/04/21 125 lb 6.4 oz (56.9 kg)  11/30/21 128 lb (58.1 kg)  11/15/21 128 lb 3.2 oz (58.2 kg)     GEN: thin male,  in no acute distress HEENT: Normal NECK: No JVD; No carotid bruits LYMPHATICS: No lymphadenopathy CARDIAC: soft systolic murmu r  RESPIRATORY:  Clear to auscultation without rales, wheezing or rhonchi  ABDOMEN: Soft, non-tender, non-distended MUSCULOSKELETAL:  No edema; No deformity  SKIN: Warm and dry NEUROLOGIC:  Alert and oriented x 3 PSYCHIATRIC:  Normal affect   ASSESSMENT:    No diagnosis found. PLAN:      Sinus tach :   secondary to other causes:    work up so far is unrevealing   2.  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   Poor appetite,  reduced PO intake.  I've asked him to be seen by primary Care .   Have referred to community health and wellness.              Medication Adjustments/Labs and Tests Ordered: Current medicines are reviewed at length with the patient today.  Concerns regarding medicines are outlined above.  No orders of the defined types were placed in this encounter.  No orders of the defined types were placed in this encounter.   There are no Patient Instructions on file for this visit.   Signed, 11/17/21, MD  12/04/2021 10:02 AM    [facility]

## 2021-12-04 NOTE — Patient Instructions (Signed)
Medication Instructions:   Your physician recommends that you continue on your current medications as directed. Please refer to the Current Medication list given to you today.  *If you need a refill on your cardiac medications before your next appointment, please call your pharmacy*   Lab Work:  None ordered  If you have labs (blood work) drawn today and your tests are completely normal, you will receive your results only by: [place] (if you have MyChart) OR A paper copy in the mail If you have any lab test that is abnormal or we need to change your treatment, we will call you to review the results.   Testing/Procedures:  Your physician has requested that you have an echocardiogram. Echocardiography is a painless test that uses sound waves to create images of your heart. It provides your doctor with information about the size and shape of your heart and how well your hearts chambers and valves are working. This procedure takes approximately one hour. There are no restrictions for this procedure.    Follow-Up: At [facility], [facility], [facility], you and your health needs are our priority.  As part of our continuing mission to provide you with exceptional heart care, we have created designated Provider Care Teams.  These Care Teams include your primary Cardiologist (physician) and Advanced Practice Providers (APPs -  Physician Assistants and Nurse Practitioners) who all work together to provide you with the care you need, when you need it.  We recommend signing up for the patient portal called "MyChart".  Sign up information is provided on this After Visit Summary.  MyChart is used to connect with patients for Virtual Visits (Telemedicine).  Patients are able to view lab/test results, encounter notes, upcoming appointments, etc.  Non-urgent messages can be sent to your provider as well.   To learn more about what you can do with MyChart, go to [email or web address].    Your next appointment:    6 month(s)  The format for your next appointment:   In Person  Provider:   Melina Copa, PA-C         Other Instructions  Try Liquid IV, gatorade for drink

## 2021-12-05 NOTE — Congregational Nurse Program (Signed)
°  Dept: [phone]   Congregational Nurse Program Note  Date of Encounter: 12/05/2021  Past Medical History: Past Medical History:  Diagnosis Date   GERD (gastroesophageal reflux disease)    H. pylori infection    Sinus tachycardia     Encounter Details: Patient arrived seeking an appointment at the mobile clinic for ongoing symptoms of occasional tachycardia and stomach upset.  He had recently seen a physician in the ED and was cleared for release with home meds; referred to cardiologist.  Cardiologist follow-up appointment cleared patient, with meds and return appointment in March.  Vitals taken.  He was educated to take meds as directed and their benefits.  Advised to return to clinic tomorrow for multiple vitamins. He has a prescription for Atarax, which he is not taking.  Advised to take the medicine for anxiety and sleep.  Nicole Cella Danford Tat RN BSn PCCN  [facility] [phone]

## 2021-12-07 ENCOUNTER — Emergency Department (HOSPITAL_BASED_OUTPATIENT_CLINIC_OR_DEPARTMENT_OTHER)
Admission: EM | Admit: 2021-12-07 | Discharge: 2021-12-08 | Disposition: A | Discharge status: Home / Self Care | Payer: Self-pay | Attending: Emergency Medicine | Admitting: Emergency Medicine

## 2021-12-07 ENCOUNTER — Other Ambulatory Visit: Payer: Self-pay

## 2021-12-07 ENCOUNTER — Encounter (HOSPITAL_BASED_OUTPATIENT_CLINIC_OR_DEPARTMENT_OTHER): Payer: Self-pay

## 2021-12-07 ENCOUNTER — Emergency Department (HOSPITAL_BASED_OUTPATIENT_CLINIC_OR_DEPARTMENT_OTHER): Payer: Self-pay | Admitting: Radiology

## 2021-12-07 DIAGNOSIS — R002 Palpitations: Secondary | ICD-10-CM | POA: Insufficient documentation

## 2021-12-07 LAB — TROPONIN I (HIGH SENSITIVITY): Troponin I (High Sensitivity): <2 ng/L (ref <18)

## 2021-12-07 LAB — CBC
HCT: 41.2 % (ref 39.0–52.0)
Hemoglobin: 14 g/dL (ref 13.0–17.0)
MCH: 27.9 pg (ref 26.0–34.0)
MCHC: 34 g/dL (ref 30.0–36.0)
MCV: 82.1 fL (ref 80.0–100.0)
Platelets: 289 10*3/uL (ref 150–400)
RBC: 5.02 MIL/uL (ref 4.22–5.81)
RDW: 12.3 % (ref 11.5–15.5)
WBC: 7 10*3/uL (ref 4.0–10.5)
nRBC: 0 % (ref 0.0–0.2)

## 2021-12-07 LAB — BASIC METABOLIC PANEL
Anion gap: 8 (ref 5–15)
BUN: 14 mg/dL (ref 6–20)
CO2: 26 mmol/L (ref 22–32)
Calcium: 9.3 mg/dL (ref 8.9–10.3)
Chloride: 105 mmol/L (ref 98–111)
Creatinine, Ser: 0.77 mg/dL (ref 0.61–1.24)
GFR, Estimated: >60 mL/min (ref >60)
Glucose, Bld: 93 mg/dL (ref 70–99)
Potassium: 3.7 mmol/L (ref 3.5–5.1)
Sodium: 139 mmol/L (ref 135–145)

## 2021-12-07 NOTE — ED Triage Notes (Signed)
Pt c/o racing HR that started while he was cooking that has since resolved. Now pt c/o CP to the L side that feels like a "pin." Pt states he also has a dry mouth and sinus pressure.

## 2021-12-08 NOTE — ED Provider Notes (Signed)
[facility] EMERGENCY DEPT Provider Note   CSN: [id number] Arrival date & time: 12/07/21  2230     History  Chief Complaint  Patient presents with   Palpitations    Peter Snow is a 26 y.o. male.   Palpitations Palpitations quality:  Regular Onset quality:  Sudden Timing:  Constant Progression:  Resolved Chronicity:  New Context: not anxiety and not caffeine   Relieved by:  Beta blockers Worsened by:  Nothing Ineffective treatments:  None tried     Home Medications Prior to Admission medications   Medication Sig Start Date End Date Taking? Authorizing Provider  hydrOXYzine (ATARAX) 25 MG tablet Take 1 tablet (25 mg total) by mouth at bedtime as needed (insomnia). Patient not taking: Reported on 12/04/2021 11/15/21   Hoy Register, MD  metoprolol tartrate (LOPRESSOR) 25 MG tablet Take 1 tablet (25 mg total) by mouth 2 (two) times daily. 12/04/21 12/04/22  Nahser, Deloris Ping, MD  nystatin cream (MYCOSTATIN) Apply to affected area 2 times daily x 7 days for fungal infection Patient not taking: Reported on 12/04/2021 11/15/21   Hoy Register, MD  omeprazole (PRILOSEC) 20 MG capsule Take 1 capsule (20 mg total) by mouth 2 (two) times daily before a meal. 11/30/21   Geoffery Lyons, MD  ondansetron (ZOFRAN-ODT) 8 MG disintegrating tablet Take 1 tablet (8 mg total) by mouth every 8 (eight) hours as needed for nausea or vomiting. Patient not taking: Reported on 12/04/2021 10/24/21   Mayers, Cari S, PA-C  polyethylene glycol powder (GLYCOLAX/MIRALAX) 17 GM/SCOOP powder Take 17 g by mouth daily. Patient not taking: Reported on 12/04/2021 11/15/21   Hoy Register, MD      Allergies    Patient has no known allergies.    Review of Systems   Review of Systems  Cardiovascular:  Positive for palpitations.   Physical Exam Updated Vital Signs BP 132/86    Pulse 65    Temp 98.4 F (36.9 C)    Resp 18    Ht 5\' 7"  (1.702 m)    Wt 57.6 kg    SpO2 100%    BMI 19.89  kg/m  Physical Exam Vitals and nursing note reviewed.  Constitutional:      Appearance: He is well-developed.  HENT:     Head: Normocephalic and atraumatic.     Nose: No congestion or rhinorrhea.     Mouth/Throat:     Mouth: Mucous membranes are moist.     Pharynx: Oropharynx is clear.  Eyes:     Pupils: Pupils are equal, round, and reactive to light.  Cardiovascular:     Rate and Rhythm: Normal rate.     Heart sounds: No murmur heard. Pulmonary:     Effort: Pulmonary effort is normal. No respiratory distress.     Breath sounds: No wheezing.  Abdominal:     General: There is no distension.  Musculoskeletal:        General: No tenderness. Normal range of motion.     Cervical back: Normal range of motion.  Skin:    General: Skin is warm and dry.  Neurological:     General: No focal deficit present.     Mental Status: He is alert.    ED Results / Procedures / Treatments   Labs (all labs ordered are listed, but only abnormal results are displayed) Labs Reviewed  BASIC METABOLIC PANEL  CBC  TROPONIN I (HIGH SENSITIVITY)    EKG None  Radiology DG Chest 2 View  Result Date:  12/07/2021 CLINICAL DATA:  Racing heart rate. EXAM: CHEST - 2 VIEW COMPARISON:  September 01, 2021 FINDINGS: The heart size and mediastinal contours are within normal limits. Both lungs are clear. The visualized skeletal structures are unremarkable. IMPRESSION: No active cardiopulmonary disease. Electronically Signed   By: Aram Candela M.D.   On: 12/07/2021 22:57    Procedures Procedures    Medications Ordered in ED Medications - No data to display  ED Course/ Medical Decision Making/ A&P                           Medical Decision Making Amount and/or Complexity of Data Reviewed Labs: ordered. Radiology: ordered.   26 year old male that has been having palpitations.  Started about a week ago was seen here and started metoprolol.  Saw cardiology few days ago who did not think it was a  primarily cardiac issue is still pending echocardiogram.  His primary doctor appointment is scheduled in a couple months.  Patient another episode tonight.  Felt some associated weakness but no shortness of breath, near syncope or other episodes.  Overall patient is improved now.  He is unsure what is going on and presents here for further evaluation.  The differential for sinus tachycardia is quite large.  I have low suspicion that he would have V. tach, V. fib or atrial fibrillation.  Cardiology did not see fit that but the patient on a monitor so I think that they think he would probably need a primary care work-up to see if there is any other causes for this.  I do not see any need to do this in the emergency room.  He is stable.  Vital signs within normal limits here and his labs/ECG are reassuring.      Final Clinical Impression(s) / ED Diagnoses Final diagnoses:  Palpitations    Rx / DC Orders ED Discharge Orders     None         Tamya Denardo, Barbara Cower, MD 12/08/21 [phone]

## 2021-12-18 ENCOUNTER — Other Ambulatory Visit: Payer: Self-pay

## 2021-12-18 ENCOUNTER — Ambulatory Visit (HOSPITAL_COMMUNITY): Payer: Self-pay | Attending: Internal Medicine

## 2021-12-18 DIAGNOSIS — R011 Cardiac murmur, unspecified: Secondary | ICD-10-CM | POA: Insufficient documentation

## 2021-12-18 DIAGNOSIS — R000 Tachycardia, unspecified: Secondary | ICD-10-CM | POA: Insufficient documentation

## 2021-12-18 LAB — ECHOCARDIOGRAM COMPLETE
Area-P 1/2: 2.71 cm2
S' Lateral: 2.4 cm

## 2021-12-19 ENCOUNTER — Other Ambulatory Visit: Payer: Self-pay

## 2021-12-19 DIAGNOSIS — R739 Hyperglycemia, unspecified: Secondary | ICD-10-CM

## 2021-12-19 LAB — GLUCOSE, POCT (MANUAL RESULT ENTRY): POC Glucose: 95 mg/dl (ref 70–99)

## 2021-12-19 NOTE — Congregational Nurse Program (Signed)
°  Dept: [phone]   Congregational Nurse Program Note  Date of Encounter: 12/19/2021  Past Medical History: Past Medical History:  Diagnosis Date   GERD (gastroesophageal reflux disease)    H. pylori infection    Sinus tachycardia     Encounter Details:  CNP Questionnaire - 12/19/21 1245       Questionnaire   Do you give verbal consent to treat you today? Yes    Location Patient Served  [facility]    Visit Setting Church or Organization    Patient Status Refugee    Insurance [facility]    Insurance Referral N/A    Medication Have Medication Insecurities    Medical Provider Yes    Screening Referrals N/A    Medical Referral N/A    Medical Appointment Made N/A    Food N/A    Transportation N/A    Housing/Utilities N/A    Interpersonal Safety N/A    Intervention Blood pressure;Blood glucose;Advocate;Navigate Healthcare System;Case Management;Counsel;Educate;Support    ED Visit Averted Yes    Life-Saving Intervention Made N/A            Patient has complaints of anxiety and insomnia. He has previously been on alprazolam 0.5 mg, sertraline 50 mg and propranolol. He is now on metoprolol and hydroxyzine. He will have an appointment with PCP the end of March to discuss anxiety further. In the meantime he was given location of mobile clinic today and will go for medication assistance.   He also notes a tightness in his neck and dryness and pain when he eats sometimes. Normal recent TSH and no obvious nodule noted.   Patient discussed tobacco exposure, alcohol use, and social support.   Laverna Peace MS4 with [facility] SOM  Cosigned:  Nicole Cella Nateisha Moyd RN BSn PCCN  [facility] [phone]-office

## 2021-12-20 ENCOUNTER — Other Ambulatory Visit: Payer: Self-pay

## 2021-12-20 ENCOUNTER — Ambulatory Visit: Payer: Self-pay | Admitting: Physician Assistant

## 2021-12-20 VITALS — BP 128/70 | HR 84 | Temp 98.2°F | Resp 18 | Ht 68.0 in | Wt 122.0 lb

## 2021-12-20 DIAGNOSIS — J302 Other seasonal allergic rhinitis: Secondary | ICD-10-CM

## 2021-12-20 DIAGNOSIS — K219 Gastro-esophageal reflux disease without esophagitis: Secondary | ICD-10-CM

## 2021-12-20 DIAGNOSIS — F411 Generalized anxiety disorder: Secondary | ICD-10-CM

## 2021-12-20 DIAGNOSIS — F5104 Psychophysiologic insomnia: Secondary | ICD-10-CM

## 2021-12-20 MED ORDER — TRAZODONE HCL 50 MG PO TABS
50.0000 mg | ORAL_TABLET | Freq: Every evening | ORAL | 1 refills | Status: DC | PRN
  Filled 2021-12-20: qty 30, 30d supply, fill #0

## 2021-12-20 MED ORDER — CETIRIZINE HCL 10 MG PO TABS
10.0000 mg | ORAL_TABLET | Freq: Every day | ORAL | 11 refills | Status: DC
  Filled 2021-12-20: qty 30, 30d supply, fill #0

## 2021-12-20 MED ORDER — PANTOPRAZOLE SODIUM 40 MG PO TBEC
40.0000 mg | DELAYED_RELEASE_TABLET | Freq: Every day | ORAL | 3 refills | Status: DC
  Filled 2021-12-20: qty 30, 30d supply, fill #0

## 2021-12-20 MED ORDER — SERTRALINE HCL 50 MG PO TABS
50.0000 mg | ORAL_TABLET | Freq: Every day | ORAL | 3 refills | Status: DC
  Filled 2021-12-20: qty 30, 30d supply, fill #0

## 2021-12-20 NOTE — Progress Notes (Signed)
Established Patient Office Visit  Subjective:  Patient ID: Peter Snow, male    DOB: [birth year]  Age: 26 y.o. MRN: [id number]  CC:  Chief Complaint  Patient presents with   Anxiety    HPI Osmond Steckman states that he has been having continued and increased anxiety as well as continued insomnia.  Reports that he did see Dr. Alvis Lemmings  On November 15, 2022, states that that time he was prescribed hydroxyzine to help with his insomnia, states that it did not offer relief.  States that he continues to have difficulty falling asleep and staying asleep due to racing thoughts.  Reports that he did go online and speak with a provider from [place], states that he was told that what he may be having could be considered panic attacks. States that he will feel a tingling in his hands and feet, his jaw will feel stiff, he will feel some chest pain.  States that he also will feel pain in his throat.  States that he did have an appointment with cardiology, and was started on metoprolol.  States that he has also been seen at the emergency department on January 12 and December 07, 2021 with complaint of heart palpitations.  Note from December 07, 2021 emergency department visit  26 year old male that has been having palpitations.  Started about a week ago was seen here and started metoprolol.  Saw cardiology few days ago who did not think it was a primarily cardiac issue is still pending echocardiogram.  His primary doctor appointment is scheduled in a couple months.  Patient another episode tonight.  Felt some associated weakness but no shortness of breath, near syncope or other episodes.  Overall patient is improved now.  He is unsure what is going on and presents here for further evaluation.  The differential for sinus tachycardia is quite large.  I have low suspicion that he would have V. tach, V. fib or atrial fibrillation.  Cardiology did not see fit that but the patient on a monitor so I  think that they think he would probably need a primary care work-up to see if there is any other causes for this.  I do not see any need to do this in the emergency room.  He is stable.  Vital signs within normal limits here and his labs/ECG are reassuring.   Due to language barrier, an interpreter was present during the history-taking and subsequent discussion (and for part of the physical exam) with this patient.   Past Medical History:  Diagnosis Date   GERD (gastroesophageal reflux disease)    H. pylori infection    Sinus tachycardia     No past surgical history on file.  No family history on file.  Social History   Socioeconomic History   Marital status: Single    Spouse name: Not on file   Number of children: Not on file   Years of education: Not on file   Highest education level: Not on file  Occupational History   Occupation: unemployed  Tobacco Use   Smoking status: Never   Smokeless tobacco: Never  Vaping Use   Vaping Use: Never used  Substance and Sexual Activity   Alcohol use: Never   Drug use: Never   Sexual activity: Not on file  Other Topics Concern   Not on file  Social History Narrative   Not on file   Social Determinants of Health   Financial Resource Strain: Not on file  Food Insecurity: Not on file  Transportation Needs: Not on file  Physical Activity: Not on file  Stress: Not on file  Social Connections: Not on file  Intimate Partner Violence: Not on file    Outpatient Medications Prior to Visit  Medication Sig Dispense Refill   hydrOXYzine (ATARAX) 25 MG tablet Take 1 tablet (25 mg total) by mouth at bedtime as needed (insomnia). (Patient not taking: Reported on 12/04/2021) 30 tablet 1   metoprolol tartrate (LOPRESSOR) 25 MG tablet Take 1 tablet (25 mg total) by mouth 2 (two) times daily. 60 tablet 11   nystatin cream (MYCOSTATIN) Apply to affected area 2 times daily x 7 days for fungal infection (Patient not taking: Reported on 12/04/2021)  30 g 0   ondansetron (ZOFRAN-ODT) 8 MG disintegrating tablet Take 1 tablet (8 mg total) by mouth every 8 (eight) hours as needed for nausea or vomiting. (Patient not taking: Reported on 12/04/2021) 20 tablet 0   polyethylene glycol powder (GLYCOLAX/MIRALAX) 17 GM/SCOOP powder Take 17 g by mouth daily. (Patient not taking: Reported on 12/04/2021) 3350 g 1   omeprazole (PRILOSEC) 20 MG capsule Take 1 capsule (20 mg total) by mouth 2 (two) times daily before a meal. 60 capsule 1   No facility-administered medications prior to visit.    No Known Allergies  ROS Review of Systems  Constitutional:  Negative for chills and fever.  HENT: Negative.    Eyes: Negative.   Respiratory:  Negative for shortness of breath.   Cardiovascular:  Positive for chest pain. Negative for palpitations.  Gastrointestinal:  Negative for nausea and vomiting.  Endocrine: Negative.   Genitourinary: Negative.   Musculoskeletal: Negative.   Skin: Negative.   Allergic/Immunologic: Negative.   Neurological:  Negative for dizziness, weakness and headaches.  Hematological: Negative.   Psychiatric/Behavioral:  Positive for dysphoric mood and sleep disturbance. Negative for self-injury and suicidal ideas. The patient is nervous/anxious.      Objective:    Physical Exam Vitals and nursing note reviewed.  Constitutional:      General: He is not in acute distress.    Appearance: Normal appearance.  HENT:     Head: Normocephalic and atraumatic.     Right Ear: External ear normal.     Left Ear: External ear normal.     Nose: Nose normal.     Mouth/Throat:     Mouth: Mucous membranes are moist.     Pharynx: Oropharynx is clear.  Eyes:     Extraocular Movements: Extraocular movements intact.     Conjunctiva/sclera: Conjunctivae normal.     Pupils: Pupils are equal, round, and reactive to light.  Cardiovascular:     Rate and Rhythm: Normal rate and regular rhythm.     Pulses: Normal pulses.     Heart sounds: Normal  heart sounds.  Pulmonary:     Effort: Pulmonary effort is normal.     Breath sounds: Normal breath sounds.  Musculoskeletal:        General: Normal range of motion.     Cervical back: Normal range of motion and neck supple.  Skin:    General: Skin is warm and dry.  Neurological:     General: No focal deficit present.     Mental Status: He is alert and oriented to person, place, and time.  Psychiatric:        Attention and Perception: Attention normal.        Mood and Affect: Mood is anxious.  Speech: Speech normal.        Behavior: Behavior normal.        Thought Content: Thought content does not include homicidal or suicidal ideation.        Cognition and Memory: Cognition normal.        Judgment: Judgment normal.    BP 128/70 (BP Location: Left Arm, Patient Position: Sitting, Cuff Size: Normal)    Pulse 84    Temp 98.2 F (36.8 C) (Oral)    Resp 18    Ht 5\' 8"  (1.727 m)    Wt 122 lb (55.3 kg)    SpO2 100%    BMI 18.55 kg/m  Wt Readings from Last 3 Encounters:  12/20/21 122 lb (55.3 kg)  12/07/21 127 lb (57.6 kg)  12/04/21 125 lb 6.4 oz (56.9 kg)     Health Maintenance Due  Topic Date Due   COVID-19 Vaccine (1) Never done   HPV VACCINES (1 - Male 2-dose series) Never done   TETANUS/TDAP  Never done   INFLUENZA VACCINE  Never done       Topic Date Due   HPV VACCINES (1 - Male 2-dose series) Never done    Lab Results  Component Value Date   TSH 4.158 11/30/2021   Lab Results  Component Value Date   WBC 7.0 12/07/2021   HGB 14.0 12/07/2021   HCT 41.2 12/07/2021   MCV 82.1 12/07/2021   PLT 289 12/07/2021   Lab Results  Component Value Date   NA 139 12/07/2021   K 3.7 12/07/2021   CO2 26 12/07/2021   GLUCOSE 93 12/07/2021   BUN 14 12/07/2021   CREATININE 0.77 12/07/2021   BILITOT 0.9 11/30/2021   ALKPHOS 63 11/30/2021   AST 22 11/30/2021   ALT 16 11/30/2021   PROT 6.6 11/30/2021   ALBUMIN 4.4 11/30/2021   CALCIUM 9.3 12/07/2021   ANIONGAP 8  12/07/2021   Lab Results  Component Value Date   CHOL 104 11/15/2020   Lab Results  Component Value Date   HDL 46 11/15/2020   Lab Results  Component Value Date   LDLCALC 42 11/15/2020   Lab Results  Component Value Date   TRIG 77 11/15/2020   No results found for: CHOLHDL Lab Results  Component Value Date   HGBA1C 5.5 11/15/2020      Assessment & Plan:   Problem List Items Addressed This Visit       Digestive   Gastroesophageal reflux disease without esophagitis   Relevant Medications   pantoprazole (PROTONIX) 40 MG tablet   Other Visit Diagnoses     Generalized anxiety disorder    -  Primary   Relevant Medications   sertraline (ZOLOFT) 50 MG tablet   traZODone (DESYREL) 50 MG tablet   pantoprazole (PROTONIX) 40 MG tablet   cetirizine (ZYRTEC ALLERGY) 10 MG tablet   Psychophysiological insomnia       Relevant Medications   traZODone (DESYREL) 50 MG tablet   pantoprazole (PROTONIX) 40 MG tablet   Seasonal allergies       Relevant Medications   pantoprazole (PROTONIX) 40 MG tablet       Meds ordered this encounter  Medications   sertraline (ZOLOFT) 50 MG tablet    Sig: Take 1 tablet (50 mg total) by mouth daily.    Dispense:  30 tablet    Refill:  3    Order Specific Question:   Supervising Provider    Answer:   11/17/2020 [  [id number]]   traZODone (DESYREL) 50 MG tablet    Sig: Take 1 tablet (50 mg total) by mouth at bedtime as needed for sleep.    Dispense:  30 tablet    Refill:  1    Order Specific Question:   Supervising Provider    Answer:   Shan LevansWRIGHT, PATRICK E [[id number]]   pantoprazole (PROTONIX) 40 MG tablet    Sig: Take 1 tablet (40 mg total) by mouth daily.    Dispense:  30 tablet    Refill:  3    Stop omeprazole    Order Specific Question:   Supervising Provider    Answer:   Shan LevansWRIGHT, PATRICK E [[id number]]   cetirizine (ZYRTEC ALLERGY) 10 MG tablet    Sig: Take 1 tablet (10 mg total) by mouth daily.    Dispense:  30 tablet    Refill:  11     Order Specific Question:   Supervising Provider    Answer:   Shan LevansWRIGHT, PATRICK E [[id number]]  1. Generalized anxiety disorder Patient agreeable to trial of Zoloft.  According to congregational nurse note, he previously was taking Zoloft, Xanax and propanolol.  Patient stated he had not previously been on those medications.    Patient education given on lifestyle modifications, coping skills.  Patient to follow-up with the mobile unit in 3 to 4 weeks.  Red flags given for prompt reevaluation.  - sertraline (ZOLOFT) 50 MG tablet; Take 1 tablet (50 mg total) by mouth daily.  Dispense: 30 tablet; Refill: 3  2. Psychophysiological insomnia Trial trazodone - traZODone (DESYREL) 50 MG tablet; Take 1 tablet (50 mg total) by mouth at bedtime as needed for sleep.  Dispense: 30 tablet; Refill: 1  3. Gastroesophageal reflux disease without esophagitis Trial Protonix - pantoprazole (PROTONIX) 40 MG tablet; Take 1 tablet (40 mg total) by mouth daily.  Dispense: 30 tablet; Refill: 3  4. Seasonal allergies Resume zyrtec     I have reviewed the patient's medical history (PMH, PSH, Social History, Family History, Medications, and allergies) , and have been updated if relevant. I spent 30 minutes reviewing chart and  face to face time with patient.    Follow-up: Return in about 4 weeks (around 01/17/2022).    Kasandra Knudsenari S Mayers, PA-C

## 2021-12-20 NOTE — Patient Instructions (Addendum)
You are going to start taking Zoloft on a daily basis.  This should start helping to control your anxiety, and may take 2 to 3 weeks before you notice any difference.  You are going to start taking trazodone at bedtime, this should help you fall asleep and stay asleep without feeling groggy the next day.  You are going to start taking Zyrtec on a daily basis, this should help you with your allergies.  You are going to start taking Protonix on a daily basis instead of the omeprazole to help you with your appetite and with your throat pain.  Please return to the mobile unit in 3 to 4 weeks for follow-up.  Roney Jaffe, PA-C Physician Assistant [facility] [email or web address]   Generalized Anxiety Disorder, Adult Generalized anxiety disorder (GAD) is a mental health condition. Unlike normal worries, anxiety related to GAD is not triggered by a specific event. These worries do not fade or get better with time. GAD interferes with relationships, work, and school. GAD symptoms can vary from mild to severe. People with severe GAD can have intense waves of anxiety with physical symptoms that are similar to panic attacks. What are the causes? The exact cause of GAD is not known, but the following are believed to have an impact: Differences in natural brain chemicals. Genes passed down from parents to children. Differences in the way threats are perceived. Development and stress during childhood. Personality. What increases the risk? The following factors may make you more likely to develop this condition: Being male. Having a family history of anxiety disorders. Being very shy. Experiencing very stressful life events, such as the death of a loved one. Having a very stressful family environment. What are the signs or symptoms? People with GAD often worry excessively about many things in their lives, such as their health and family.  Symptoms may also include: Mental and emotional symptoms: Worrying excessively about natural disasters. Fear of being late. Difficulty concentrating. Fears that others are judging your performance. Physical symptoms: Fatigue. Headaches, muscle tension, muscle twitches, trembling, or feeling shaky. Feeling like your heart is pounding or beating very fast. Feeling out of breath or like you cannot take a deep breath. Having trouble falling asleep or staying asleep, or experiencing restlessness. Sweating. Nausea, diarrhea, or irritable bowel syndrome (IBS). Behavioral symptoms: Experiencing erratic moods or irritability. Avoidance of new situations. Avoidance of people. Extreme difficulty making decisions. How is this diagnosed? This condition is diagnosed based on your symptoms and medical history. You will also have a physical exam. Your health care provider may perform tests to rule out other possible causes of your symptoms. To be diagnosed with GAD, a person must have anxiety that: Is out of his or her control. Affects several different aspects of his or her life, such as work and relationships. Causes distress that makes him or her unable to take part in normal activities. Includes at least three symptoms of GAD, such as restlessness, fatigue, trouble concentrating, irritability, muscle tension, or sleep problems. Before your health care provider can confirm a diagnosis of GAD, these symptoms must be present more days than they are not, and they must last for 6 months or longer. How is this treated? This condition may be treated with: Medicine. Antidepressant medicine is usually prescribed for long-term daily control. Anti-anxiety medicines may be added in severe cases, especially when panic attacks occur. Talk therapy (psychotherapy). Certain types of talk therapy can be helpful in treating GAD by providing support,  education, and guidance. Options include: Cognitive behavioral  therapy (CBT). People learn coping skills and self-calming techniques to ease their physical symptoms. They learn to identify unrealistic thoughts and behaviors and to replace them with more appropriate thoughts and behaviors. Acceptance and commitment therapy (ACT). This treatment teaches people how to be mindful as a way to cope with unwanted thoughts and feelings. Biofeedback. This process trains you to manage your body's response (physiological response) through breathing techniques and relaxation methods. You will work with a therapist while machines are used to monitor your physical symptoms. Stress management techniques. These include yoga, meditation, and exercise. A mental health specialist can help determine which treatment is best for you. Some people see improvement with one type of therapy. However, other people require a combination of therapies. Follow these instructions at home: Lifestyle Maintain a consistent routine and schedule. Anticipate stressful situations. Create a plan and allow extra time to work with your plan. Practice stress management or self-calming techniques that you have learned from your therapist or your health care provider. Exercise regularly and spend time outdoors. Eat a healthy diet that includes plenty of vegetables, fruits, whole grains, low-fat dairy products, and lean protein. Do not eat a lot of foods that are high in fat, added sugar, or salt (sodium). Drink plenty of water. Avoid alcohol. Alcohol can increase anxiety. Avoid caffeine and certain over-the-counter cold medicines. These may make you feel worse. Ask your pharmacist which medicines to avoid. General instructions Take over-the-counter and prescription medicines only as told by your health care provider. Understand that you are likely to have setbacks. Accept this and be kind to yourself as you persist to take better care of yourself. Anticipate stressful situations. Create a plan and allow  extra time to work with your plan. Recognize and accept your accomplishments, even if you judge them as small. Spend time with people who care about you. Keep all follow-up visits. This is important. Where to find more information [employer] of Mental [employer]: http://www.maynard.net/ Substance Abuse and Mental Health Services: SkateOasis.com.pt Contact a health care provider if: Your symptoms do not get better. Your symptoms get worse. You have signs of depression, such as: A persistently sad or irritable mood. Loss of enjoyment in activities that used to bring you joy. Change in weight or eating. Changes in sleeping habits. Get help right away if: You have thoughts about hurting yourself or others. If you ever feel like you may hurt yourself or others, or have thoughts about taking your own life, get help right away. Go to your nearest emergency department or: Call your local emergency services (911 in the [place] Call a suicide crisis helpline, such as the National Suicide Prevention Lifeline at 562-396-4699 or 988 in the [place] This is open 24 hours a day in the [place] Text the Crisis Text Line at (929)182-6730 (in the [place] Summary Generalized anxiety disorder (GAD) is a mental health condition that involves worry that is not triggered by a specific event. People with GAD often worry excessively about many things in their lives, such as their health and family. GAD may cause symptoms such as restlessness, trouble concentrating, sleep problems, frequent sweating, nausea, diarrhea, headaches, and trembling or muscle twitching. A mental health specialist can help determine which treatment is best for you. Some people see improvement with one type of therapy. However, other people require a combination of therapies. This information is not intended to replace advice given to you by your health care provider. Make sure you  discuss any questions you have with your health care provider. Document Revised:  05/31/2021 Document Reviewed: 02/26/2021 Elsevier Patient Education  2022 [employer]

## 2021-12-21 DIAGNOSIS — J302 Other seasonal allergic rhinitis: Secondary | ICD-10-CM | POA: Insufficient documentation

## 2021-12-21 DIAGNOSIS — F5104 Psychophysiologic insomnia: Secondary | ICD-10-CM | POA: Insufficient documentation

## 2021-12-21 DIAGNOSIS — F411 Generalized anxiety disorder: Secondary | ICD-10-CM | POA: Insufficient documentation

## 2021-12-22 ENCOUNTER — Encounter (HOSPITAL_BASED_OUTPATIENT_CLINIC_OR_DEPARTMENT_OTHER): Payer: Self-pay | Admitting: Emergency Medicine

## 2021-12-22 ENCOUNTER — Other Ambulatory Visit: Payer: Self-pay

## 2021-12-22 ENCOUNTER — Emergency Department (HOSPITAL_BASED_OUTPATIENT_CLINIC_OR_DEPARTMENT_OTHER)
Admission: EM | Admit: 2021-12-22 | Discharge: 2021-12-22 | Disposition: A | Discharge status: Home / Self Care | Payer: Medicaid Other | Attending: Emergency Medicine | Admitting: Emergency Medicine

## 2021-12-22 DIAGNOSIS — R1013 Epigastric pain: Secondary | ICD-10-CM | POA: Insufficient documentation

## 2021-12-22 DIAGNOSIS — Z79899 Other long term (current) drug therapy: Secondary | ICD-10-CM | POA: Insufficient documentation

## 2021-12-22 MED ORDER — ALUM & MAG HYDROXIDE-SIMETH 200-200-20 MG/5ML PO SUSP
30.0000 mL | Freq: Once | ORAL | Status: AC
Start: 2021-12-22 — End: 2021-12-22
  Administered 2021-12-22: 30 mL via ORAL
  Filled 2021-12-22: qty 30

## 2021-12-22 MED ORDER — HYOSCYAMINE SULFATE 0.125 MG SL SUBL
0.2500 mg | SUBLINGUAL_TABLET | Freq: Once | SUBLINGUAL | Status: AC
Start: 2021-12-22 — End: 2021-12-22
  Administered 2021-12-22: 0.25 mg via SUBLINGUAL
  Filled 2021-12-22: qty 2

## 2021-12-22 NOTE — ED Provider Notes (Signed)
[facility] EMERGENCY DEPT Provider Note  CSN: [id number] Arrival date & time: 12/22/21 0504  Chief Complaint(s) Abdominal Pain and Dehydration  HPI Peter Snow is a 26 y.o. male    Abdominal Pain Pain location:  Epigastric Pain quality: burning   Pain severity:  Moderate Onset quality:  Gradual Timing:  Intermittent Progression:  Waxing and waning Chronicity:  Recurrent Context: not alcohol use, not sick contacts and not suspicious food intake   Relieved by:  Flatus Worsened by:  Nothing Associated symptoms: no chest pain, no fatigue, no fever, no flatus, no nausea, no shortness of breath and no vomiting    Patient also reports feeling warm sensation throughout his body.  Reports he is an [place] refugee [place] 1[place] with some, but minimal support.  Past Medical History Past Medical History:  Diagnosis Date   GERD (gastroesophageal reflux disease)    H. pylori infection    Sinus tachycardia    Patient Active Problem List   Diagnosis Date Noted   Generalized anxiety disorder 12/21/2021   Psychophysiological insomnia 12/21/2021   Seasonal allergies 12/21/2021   Sinus tachycardia 12/04/2021   History of Helicobacter pylori infection 10/31/2021   Gastroesophageal reflux disease without esophagitis 10/31/2021   Tooth pain 11/07/2020   Gonorrhea 05/2020   Eye pain, bilateral 04/2020   Forgetfulness 04/2020   Knee buckling 2020   Stomach pain 2019   Helicobacter pylori stool test positive 2019   Home Medication(s) Prior to Admission medications   Medication Sig Start Date End Date Taking? Authorizing Provider  cetirizine (ZYRTEC ALLERGY) 10 MG tablet Take 1 tablet (10 mg total) by mouth daily. 12/20/21   Mayers, Cari S, PA-C  hydrOXYzine (ATARAX) 25 MG tablet Take 1 tablet (25 mg total) by mouth at bedtime as needed (insomnia). Patient not taking: Reported on 12/04/2021 11/15/21   Hoy Register, MD  metoprolol tartrate  (LOPRESSOR) 25 MG tablet Take 1 tablet (25 mg total) by mouth 2 (two) times daily. 12/04/21 12/04/22  Nahser, Deloris Ping, MD  nystatin cream (MYCOSTATIN) Apply to affected area 2 times daily x 7 days for fungal infection Patient not taking: Reported on 12/04/2021 11/15/21   Hoy Register, MD  ondansetron (ZOFRAN-ODT) 8 MG disintegrating tablet Take 1 tablet (8 mg total) by mouth every 8 (eight) hours as needed for nausea or vomiting. Patient not taking: Reported on 12/04/2021 10/24/21   Mayers, Cari S, PA-C  pantoprazole (PROTONIX) 40 MG tablet Take 1 tablet (40 mg total) by mouth daily. 12/20/21   Mayers, Cari S, PA-C  polyethylene glycol powder (GLYCOLAX/MIRALAX) 17 GM/SCOOP powder Take 17 g by mouth daily. Patient not taking: Reported on 12/04/2021 11/15/21   Hoy Register, MD  sertraline (ZOLOFT) 50 MG tablet Take 1 tablet (50 mg total) by mouth daily. 12/20/21   Mayers, Cari S, PA-C  traZODone (DESYREL) 50 MG tablet Take 1 tablet (50 mg total) by mouth at bedtime as needed for sleep. 12/20/21   Mayers, Kasandra Knudsen, PA-C  Allergies Patient has no known allergies.  Review of Systems Review of Systems  Constitutional:  Negative for fatigue and fever.  Respiratory:  Negative for shortness of breath.   Cardiovascular:  Negative for chest pain.  Gastrointestinal:  Positive for abdominal pain. Negative for flatus, nausea and vomiting.  As noted in HPI  Physical Exam Vital Signs  I have reviewed the triage vital signs BP 120/74    Pulse 62    Temp 98 F (36.7 C) (Oral)    Resp 18    Ht 5\' 8"  (1.727 m)    Wt 55.3 kg    SpO2 100%    BMI 18.55 kg/m   Physical Exam Vitals reviewed.  Constitutional:      General: He is not in acute distress.    Appearance: He is well-developed. He is not diaphoretic.  HENT:     Head: Normocephalic and atraumatic.     Right Ear: External ear  normal.     Left Ear: External ear normal.     Nose: Nose normal.     Mouth/Throat:     Mouth: Mucous membranes are moist.  Eyes:     General: No scleral icterus.    Conjunctiva/sclera: Conjunctivae normal.  Neck:     Trachea: Phonation normal.  Cardiovascular:     Rate and Rhythm: Normal rate and regular rhythm.  Pulmonary:     Effort: Pulmonary effort is normal. No respiratory distress.     Breath sounds: No stridor.  Abdominal:     General: There is no distension.     Tenderness: There is no abdominal tenderness. There is no guarding or rebound.  Musculoskeletal:        General: Normal range of motion.     Cervical back: Normal range of motion.  Neurological:     Mental Status: He is alert and oriented to person, place, and time.  Psychiatric:        Behavior: Behavior normal.    ED Results and Treatments Labs (all labs ordered are listed, but only abnormal results are displayed) Labs Reviewed - No data to display                                                                                                                       EKG  EKG Interpretation  Date/Time:    Ventricular Rate:    PR Interval:    QRS Duration:   QT Interval:    QTC Calculation:   R Axis:     Text Interpretation:         Radiology No results found.  Pertinent labs & imaging results that were available during my care of the patient were reviewed by me and considered in my medical decision making (see MDM for details).  Medications Ordered in ED Medications  alum & mag hydroxide-simeth (MAALOX/MYLANTA) 200-200-20 MG/5ML suspension 30 mL (30 mLs Oral Given 12/22/21 0542)  hyoscyamine (LEVSIN SL) SL tablet 0.25 mg (0.25 mg Sublingual Given 12/22/21 0542)  Procedures Procedures  (including critical care time)  Medical Decision Making / ED Course         Epigastric discomfort Abd benign Likely exacerbation of known GERD. Patient also endorsing symptoms consistent with anxiety. Low suspicion for serious intraabdominal or cardiac process. Will treat symptomatically and reassess. Expand if needed.  Management: GI cocktail Therapeutic conversation  Reassessment: Symptoms improved Tolerating PO   Final Clinical Impression(s) / ED Diagnoses Final diagnoses:  Epigastric discomfort   The patient appears reasonably screened and/or stabilized for discharge and I doubt any other medical condition or other [facility] requiring further screening, evaluation, or treatment in the ED at this time prior to discharge. Safe for discharge with strict return precautions.  Disposition: Discharge  Condition: Good  I have discussed the results, Dx and Tx plan with the patient/family who expressed understanding and agree(s) with the plan. Discharge instructions discussed at length. The patient/family was given strict return precautions who verbalized understanding of the instructions. No further questions at time of discharge.    ED Discharge Orders     None        Follow Up: Primary care provider  Schedule an appointment as soon as possible for a visit             This chart was dictated using voice recognition software.  Despite best efforts to proofread,  errors can occur which can change the documentation meaning.    Nira Connardama, Johanna Stafford Eduardo, MD 12/22/21 [phone]

## 2021-12-22 NOTE — ED Triage Notes (Signed)
°  Patient comes in with epigastric pain and dry mouth that has been going on for a couple days.  Patient states he ate dinner and some fruit but was unable to fall asleep.  Endorses a burning sensation in his epigastric area.  Patient states since his stomach was upset he did not take his night time medications of sertraline, metoprolol, trazodone, and protonix.  Patient states his hands feel shaky and mouth is dry.  No pain at this time.

## 2021-12-26 ENCOUNTER — Emergency Department
Admission: EM | Admit: 2021-12-26 | Discharge: 2021-12-26 | Disposition: A | Discharge status: Home / Self Care | Payer: Self-pay | Attending: Emergency Medicine | Admitting: Emergency Medicine

## 2021-12-26 DIAGNOSIS — K219 Gastro-esophageal reflux disease without esophagitis: Secondary | ICD-10-CM | POA: Insufficient documentation

## 2021-12-26 DIAGNOSIS — F419 Anxiety disorder, unspecified: Secondary | ICD-10-CM | POA: Insufficient documentation

## 2021-12-26 HISTORY — DX: Anxiety disorder, unspecified: F41.9

## 2021-12-26 HISTORY — DX: Gastro-esophageal reflux disease without esophagitis: K21.9

## 2021-12-26 LAB — COMPREHENSIVE METABOLIC PANEL
ALT: 16 U/L (ref 0–55)
AST (SGOT): 18 U/L (ref 5–41)
Albumin/Globulin Ratio: 1.7 (ref 0.9–2.2)
Albumin: 4.8 g/dL (ref 3.5–5.0)
Alkaline Phosphatase: 88 U/L (ref 37–117)
Anion Gap: 10 (ref 5.0–15.0)
BUN: 10 mg/dL (ref 9.0–28.0)
Bilirubin, Total: 0.6 mg/dL (ref 0.2–1.2)
CO2: 25 mEq/L (ref 17–29)
Calcium: 9.9 mg/dL (ref 8.5–10.5)
Chloride: 102 mEq/L (ref 99–111)
Creatinine: 0.9 mg/dL (ref 0.5–1.5)
Globulin: 2.8 g/dL (ref 2.0–3.6)
Glucose: 128 mg/dL — ABNORMAL HIGH (ref 70–100)
Potassium: 3.7 mEq/L (ref 3.5–5.3)
Protein, Total: 7.6 g/dL (ref 6.0–8.3)
Sodium: 137 mEq/L (ref 135–145)

## 2021-12-26 LAB — URINALYSIS REFLEX TO MICROSCOPIC EXAM - REFLEX TO CULTURE
Bilirubin, UA: NEGATIVE
Blood, UA: NEGATIVE
Glucose, UA: NEGATIVE
Ketones UA: NEGATIVE
Leukocyte Esterase, UA: NEGATIVE
Nitrite, UA: NEGATIVE
Protein, UR: NEGATIVE
Specific Gravity UA: 1.015 (ref 1.001–1.035)
Urine pH: 6.5 (ref 5.0–8.0)
Urobilinogen, UA: 0.2 mg/dL (ref 0.2–2.0)

## 2021-12-26 LAB — CBC AND DIFFERENTIAL
Absolute NRBC: 0 10*3/uL (ref 0.00–0.00)
Basophils Absolute Automated: 0.03 10*3/uL (ref 0.00–0.08)
Basophils Automated: 0.3 %
Eosinophils Absolute Automated: 0.06 10*3/uL (ref 0.00–0.44)
Eosinophils Automated: 0.6 %
Hematocrit: 44.4 % (ref 37.6–49.6)
Hgb: 15.5 g/dL (ref 12.5–17.1)
Immature Granulocytes Absolute: 0.02 10*3/uL (ref 0.00–0.07)
Immature Granulocytes: 0.2 %
Instrument Absolute Neutrophil Count: 7.39 10*3/uL — ABNORMAL HIGH (ref 1.10–6.33)
Lymphocytes Absolute Automated: 2.54 10*3/uL (ref 0.42–3.22)
Lymphocytes Automated: 23.9 %
MCH: 28.1 pg (ref 25.1–33.5)
MCHC: 34.9 g/dL (ref 31.5–35.8)
MCV: 80.4 fL (ref 78.0–96.0)
MPV: 9.7 fL (ref 8.9–12.5)
Monocytes Absolute Automated: 0.58 10*3/uL (ref 0.21–0.85)
Monocytes: 5.5 %
Neutrophils Absolute: 7.39 10*3/uL — ABNORMAL HIGH (ref 1.10–6.33)
Neutrophils: 69.5 %
Nucleated RBC: 0 /100 WBC (ref 0.0–0.0)
Platelets: 329 10*3/uL (ref 142–346)
RBC: 5.52 10*6/uL (ref 4.20–5.90)
RDW: 12 % (ref 11–15)
WBC: 10.62 10*3/uL — ABNORMAL HIGH (ref 3.10–9.50)

## 2021-12-26 LAB — GFR: EGFR: >60

## 2021-12-26 IMAGING — CR DG CHEST 1V
1 series · 1 of 1 positions shown · non-contrast
Comparison: None.

CLINICAL DATA: 25-year-old male with positive TB test

EXAM:
CHEST  1 VIEW

[w chest pa]
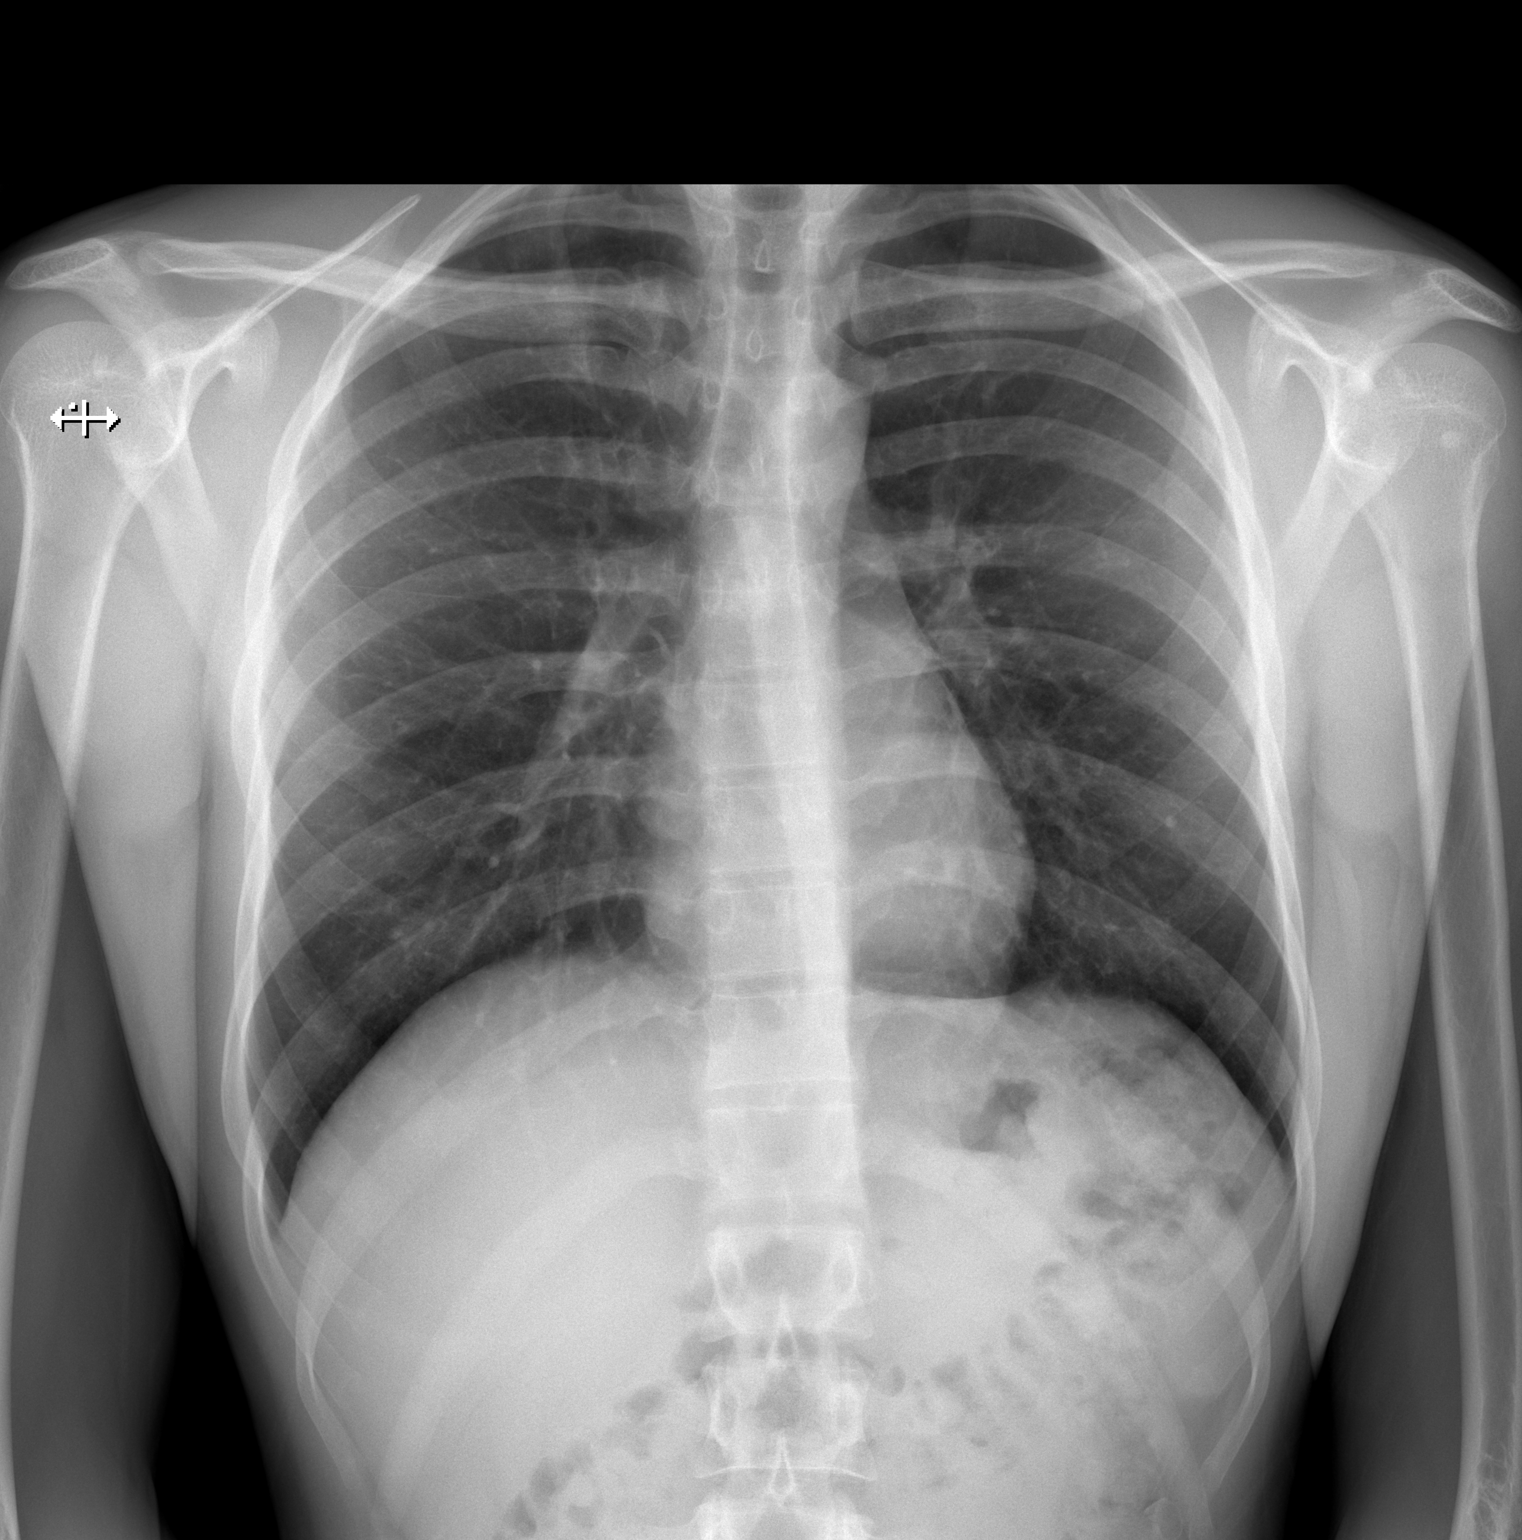

[1 of 1 positions shown; findings below may reference images not displayed]

FINDINGS: The heart size and mediastinal contours are within normal limits.
Both lungs are clear. The visualized skeletal structures are
unremarkable.
IMPRESSION: No active disease.

## 2021-12-26 MED ORDER — ALUM & MAG HYDROXIDE-SIMETH 200-200-20 MG/5ML PO SUSP
30.0000 mL | Freq: Once | ORAL | Status: AC
Start: 2021-12-26 — End: 2021-12-26
  Administered 2021-12-26: 30 mL via ORAL
  Filled 2021-12-26: qty 30

## 2021-12-26 MED ORDER — ONDANSETRON 4 MG PO TBDP
4.0000 mg | ORAL_TABLET | Freq: Four times a day (QID) | ORAL | 0 refills | Status: AC | PRN
Start: 2021-12-26 — End: ?

## 2021-12-26 MED ORDER — LIDOCAINE VISCOUS HCL 2 % MT SOLN
10.0000 mL | Freq: Once | OROMUCOSAL | Status: AC
Start: 2021-12-26 — End: 2021-12-26
  Administered 2021-12-26: 10 mL via OROMUCOSAL
  Filled 2021-12-26: qty 15

## 2021-12-26 MED ORDER — FAMOTIDINE 20 MG PO TABS
20.0000 mg | ORAL_TABLET | Freq: Once | ORAL | Status: AC
Start: 2021-12-26 — End: 2021-12-26
  Administered 2021-12-26: 20 mg via ORAL
  Filled 2021-12-26: qty 1

## 2021-12-26 MED ORDER — SUCRALFATE 1 G PO TABS
1.0000 g | ORAL_TABLET | Freq: Four times a day (QID) | ORAL | 0 refills | Status: AC
Start: 2021-12-26 — End: 2022-01-05

## 2021-12-26 MED ORDER — DIPHENHYDRAMINE HCL 50 MG/ML IJ SOLN
25.0000 mg | Freq: Once | INTRAMUSCULAR | Status: AC
Start: 2021-12-26 — End: 2021-12-26
  Administered 2021-12-26: 25 mg via INTRAVENOUS
  Filled 2021-12-26: qty 1

## 2021-12-26 NOTE — ED Provider Notes (Signed)
EMERGENCY DEPARTMENT HISTORY AND PHYSICAL EXAM     None        Date: 12/26/2021  Patient Name: Levi Castillo  Attending Physician: Costella Hatcher, MD  Advanced Practice Provider: Aretha Parrot, PA    History of Presenting Illness       History Provided By: Patient  Interpreter: Utilized  Telephone medical interpreter     Chief Complaint:  Chief Complaint   Patient presents with   . Heartburn   . Nausea         Additional History: Levi Castillo is a 26 y.o. male history of GERD and anxiety presenting to the ED with reported symptoms of nausea and epigastric pain that started today.  Patient states that he is taking omeprazole today to help with no improvement.  Patient states that he was feeling anxious and started having chest pain which is since resolved but now is feeling this burning acid feeling in the upper abdomen..  Patient states that he is also been having darker than normal urine and is concerned denies any penile pain or discharge denies any fever or chills, nausea vomiting diarrhea.    PCP: No primary care provider on file.  SPECIALISTS:    No current facility-administered medications for this encounter.     Current Outpatient Medications   Medication Sig Dispense Refill   . sucralfate (CARAFATE) 1 g tablet Take 1 tablet (1 g) by mouth 4 (four) times daily 30 tablet 0         Past History     Past Medical History:  Past Medical History:   Diagnosis Date   . Anxiety    . Gastroesophageal reflux disease        Past Surgical History:  History reviewed. No pertinent surgical history.    Family History:  History reviewed. No pertinent family history.    Social History:  Social History     Tobacco Use   . Smoking status: Never   . Smokeless tobacco: Never   Vaping Use   . Vaping Use: Never used   Substance Use Topics   . Alcohol use: Never   . Drug use: Never       Allergies:  No Known Allergies    Review of Systems     Review of Systems    Physical Exam     Vitals:    12/26/21 0101 12/26/21 0103    BP:  (!) 149/95   Pulse:  79   Resp:  18   Temp:  97.5 F (36.4 C)   TempSrc:  Oral   SpO2:  100%   Weight: 57.7 kg        Physical Exam    Diagnostic Study Results     Labs -     Results       ** No results found for the last 24 hours. **            Radiologic Studies -   Radiology Results (24 Hour)       ** No results found for the last 24 hours. **        .    Medical Decision Making   I am the first provider for this patient.    I reviewed the vital signs, available nursing notes, past medical history, past surgical history, family history and social history.    Vital Signs-Reviewed the patient's vital signs.   Patient Vitals for the past 12 hrs:   BP Temp Pulse  Resp   12/26/21 0103 (!) 149/95 97.5 F (36.4 C) 79 18       Pulse Oximetry Analysis - {Pulse ox analysis:60388} SpO2: 100 % on RA    EKG:  Interpreted by the EP.   Time Interpreted: ***   Rate: ***   Rhythm: {Rhythm including paccardio:30870}   Interpretation:***   Comparison: {No prior study available [phone]}    ASA:   {GRAFASPIRIN:39611}    Ct head in the ED? Order dysphagia screen and NIHSS for admitted patients: ***    Sepsis: ***      Critical Care:    ***    Procedures:   Procedures       Old Medical Records: {Records review:60251}       ED Medications  Medications - No data to display    ED Course:          Provider Notes:       The patient/guardian voiced understanding of the return precautions. Discussed most common side effects of medications given. Solicited, welcomed and answered all patient/guardian questions.    D/w Degraaf, Etta Quill, MD      Diagnosis     Clinical Impression: No diagnosis found.    Treatment Plan:   ED Disposition       None              **Please note, this document was generated using voice recognition software which was designed to generate medical notes and help improve efficiency in patient care.  Unfortunately, the software does generate grammatical errors and typos.  While this note was reviewed and edited  where appropriate, its possible that some of these errors may still be present.        _______________________________    CHART OWNERSHIP: I, Caroline More, PA-C, am the primary clinician of record.  _______________________________

## 2021-12-26 NOTE — ED Triage Notes (Signed)
Levi Castillo is a 26 y.o. male with a cc of heartburn and nausea x 30 mins PTA. Pt reports he took omeprazole PTA.   Blood pressure (!) 149/95, pulse 79, temperature 97.5 F (36.4 C), temperature source Oral, resp. rate 18, weight 57.7 kg, SpO2 100 %.

## 2021-12-26 NOTE — ED Notes (Signed)
Pt was leaving before being seen by provider after being roomed. Pt appeared anxious but frustrated about having to wait. This RN talked to the patient and patient decided to stay.

## 2021-12-28 ENCOUNTER — Telehealth: Payer: Self-pay | Admitting: Internal Medicine

## 2021-12-28 NOTE — Telephone Encounter (Signed)
From: Thayer Headings, MD  Sent: 12/04/2021  11:05 AM EST  To: Carlyle Basques, MD   Theron Arista  This patient was seen for sinus tachycardia.  No real cardiac issues.  Just faster than normal HR .   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  CXR in our ER was unremarkable last week.   I have [facility] to [facility].   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     I dont think there is anything to do from a cardiac standpoint but Im unsure of if / how to evaluate him for miliary TB .    Thanks   [employer]

## 2022-01-02 ENCOUNTER — Other Ambulatory Visit: Payer: Self-pay

## 2022-01-02 ENCOUNTER — Ambulatory Visit (INDEPENDENT_AMBULATORY_CARE_PROVIDER_SITE_OTHER): Payer: Self-pay | Admitting: Internal Medicine

## 2022-01-02 DIAGNOSIS — Z227 Latent tuberculosis: Secondary | ICD-10-CM

## 2022-01-02 NOTE — Progress Notes (Signed)
[facility]  Reason for Consult: Abdominal pain Referring Provider: Dr. Diona Browner  Assessment: I strongly suspect that his abdominal pain is due to persistent/relapsed Helicobacter pylori gastritis.  I asked him to stop pantoprazole today.  He will return in 2 weeks for H. pylori breath test to determine if he needs retreatment.  I do not see any evidence to suggest active tuberculosis.  Plan: Hold pantoprazole and follow-up in 2 weeks  Patient Active Problem List   Diagnosis Date Noted   Generalized anxiety disorder 12/21/2021   Psychophysiological insomnia 12/21/2021   Seasonal allergies 12/21/2021   Sinus tachycardia [id number]   History of Helicobacter pylori infection 10/31/2021   Gastroesophageal reflux disease without esophagitis 10/31/2021   Tooth pain 11/07/2020   Gonorrhea 05/2020   Eye pain, bilateral 04/2020   Forgetfulness 04/2020   Knee buckling 2020   Stomach pain [id number]   Helicobacter pylori stool test positive 2019    Patient's Medications  New Prescriptions   No medications on file  Previous Medications   CETIRIZINE (ZYRTEC ALLERGY) 10 MG TABLET    Take 1 tablet (10 mg total) by mouth daily.   HYDROXYZINE (ATARAX) 25 MG TABLET    Take 1 tablet (25 mg total) by mouth at bedtime as needed (insomnia).   METOPROLOL TARTRATE (LOPRESSOR) 25 MG TABLET    Take 1 tablet (25 mg total) by mouth 2 (two) times daily.   NYSTATIN CREAM (MYCOSTATIN)    Apply to affected area 2 times daily x 7 days for fungal infection   ONDANSETRON (ZOFRAN-ODT) 8 MG DISINTEGRATING TABLET    Take 1 tablet (8 mg total) by mouth every 8 (eight) hours as needed for nausea or vomiting.   PANTOPRAZOLE (PROTONIX) 40 MG TABLET    Take 1 tablet (40 mg total) by mouth daily.   POLYETHYLENE GLYCOL POWDER (GLYCOLAX/MIRALAX) 17 GM/SCOOP POWDER    Take 17 g by mouth daily.   SERTRALINE (ZOLOFT) 50 MG TABLET    Take 1 tablet (50 mg total) by mouth daily.   TRAZODONE  (DESYREL) 50 MG TABLET    Take 1 tablet (50 mg total) by mouth at bedtime as needed for sleep.  Modified Medications   No medications on file  Discontinued Medications   No medications on file    HPI: Peter Snow is a 26 y.o. male who emigrated from his native [place] in August 2021.  He recalls being told that he had latent tuberculosis at that time.  He says that he has never had active tuberculosis or pneumonia and has never required any treatment for tuberculosis.  He has a history of Helicobacter pylori infection and acid reflux.  This was first documented in his chart in December 2021.  He was also tested again in October of last year and was positive.  He was treated with clarithromycin, amoxicillin and omeprazole.  He recalls that his acid reflux got transiently better but then slowly worsen over the last few months.  He notes that his abdominal pain and reflux vary depending on what he eats.  He gets some relief with pantoprazole.  He has also had periods where he becomes very anxious.  He has had several panic attacks.  He has had difficulty sleeping.  His appetite has been poor for the past few months.  He believes that he may have lost a few pounds.  He was recently started on metoprolol for sinus tachycardia and sertraline and says  that he is feeling better.    Review of Systems: Review of Systems  Constitutional:  Negative for chills, diaphoresis and fever.  HENT:  Negative for congestion and sinus pain.   Respiratory:  Negative for cough, hemoptysis, sputum production and shortness of breath.   Cardiovascular:  Positive for chest pain and palpitations.       He has some chest tightness bilaterally whenever he has a panic attack.  Gastrointestinal:  Positive for abdominal pain and heartburn. Negative for blood in stool, constipation, diarrhea, nausea and vomiting.  Neurological:  Positive for dizziness.  Psychiatric/Behavioral:  Negative for depression. The patient is  nervous/anxious and has insomnia.      Past Medical History:  Diagnosis Date   GERD (gastroesophageal reflux disease)    H. pylori infection    Sinus tachycardia     Social History   Tobacco Use   Smoking status: Never   Smokeless tobacco: Never  Vaping Use   Vaping Use: Never used  Substance Use Topics   Alcohol use: Never   Drug use: Never    No family history on file. No Known Allergies  OBJECTIVE: Vitals:   01/02/22 1510  Weight: 125 lb 9.6 oz (57 kg)   Body mass index is 19.1 kg/m.   Physical Exam Constitutional:      Comments: He is calm and pleasant.  He was interviewed with the aid of the video Dari interpreter.  His recorded weights are essentially unchanged over the past year.  Cardiovascular:     Rate and Rhythm: Normal rate and regular rhythm.     Heart sounds: No murmur heard. Pulmonary:     Effort: Pulmonary effort is normal.     Breath sounds: Normal breath sounds.  Abdominal:     General: There is no distension.     Palpations: Abdomen is soft.     Tenderness: There is no abdominal tenderness.  Psychiatric:        Mood and Affect: Mood normal.    Microbiology: No results found for this or any previous visit (from the past 240 hour(s)).  Michel Bickers, MD [facility] [phone] pager   [phone] cell 01/02/2022, 3:10 PM

## 2022-01-10 ENCOUNTER — Other Ambulatory Visit (HOSPITAL_COMMUNITY): Payer: Self-pay

## 2022-01-16 ENCOUNTER — Ambulatory Visit (INDEPENDENT_AMBULATORY_CARE_PROVIDER_SITE_OTHER): Payer: Self-pay | Admitting: Internal Medicine

## 2022-01-16 ENCOUNTER — Other Ambulatory Visit: Payer: Self-pay

## 2022-01-16 DIAGNOSIS — Z8619 Personal history of other infectious and parasitic diseases: Secondary | ICD-10-CM

## 2022-01-16 NOTE — Assessment & Plan Note (Addendum)
I have ordered Helicobacter pylori breath test today.  I will call him when I have the test results.  Addendum: His H. Pylori breath test remains positive. He will come in tomorrow morning to discuss a new treatment regimen. I plan on a quad regimen consisting of bismuth, metronidazole, tetracycline and a PPI for 14 days.

## 2022-01-16 NOTE — Progress Notes (Addendum)
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   HYDROXYZINE (ATARAX) 25 MG TABLET    Take 1 tablet (25 mg total) by mouth at bedtime as needed (insomnia).   METOPROLOL TARTRATE (LOPRESSOR) 25 MG TABLET    Take 1 tablet (25 mg total) by mouth 2 (two) times daily.   NYSTATIN CREAM (MYCOSTATIN)    Apply to affected area 2 times daily x 7 days for fungal infection   ONDANSETRON (ZOFRAN-ODT) 8 MG DISINTEGRATING TABLET    Take 1 tablet (8 mg total) by mouth every 8 (eight) hours as needed for nausea or vomiting.   PANTOPRAZOLE (PROTONIX) 40 MG TABLET    Take 1 tablet (40 mg total) by mouth daily.   POLYETHYLENE GLYCOL POWDER (GLYCOLAX/MIRALAX) 17 GM/SCOOP POWDER    Take 17 g by mouth daily.   SERTRALINE (ZOLOFT) 50 MG TABLET    Take 1 tablet (50 mg total) by mouth daily.   TRAZODONE (DESYREL) 50 MG TABLET    Take 1 tablet (50 mg total) by mouth at bedtime as needed for sleep.  Modified Medications   No medications on file  Discontinued Medications   No medications on file    Subjective: Peter Snow is seen for his routine follow-up visit.  He did stop pantoprazole after his initial visit 2 weeks ago.  He has had some increase in epigastric and right upper quadrant discomfort and worsening acid reflux.  Tums provides only minimal relief.  He is feeling weak. weeks  He says that he has been feeling  less anxious.  Review of Systems: Review of Systems  Constitutional:  Positive for malaise/fatigue. Negative for fever and weight loss.  Cardiovascular:  Positive for chest pain.  Gastrointestinal:  Positive for abdominal pain and heartburn. Negative for diarrhea, nausea and vomiting.  Psychiatric/Behavioral:  The patient is nervous/anxious.    Past Medical History:  Diagnosis Date   GERD (gastroesophageal reflux disease)    H. pylori infection    Sinus tachycardia     Social History   Tobacco Use   Smoking status: Never   Smokeless tobacco: Never  Vaping Use   Vaping Use: Never used  Substance Use Topics   Alcohol use: Never   Drug use: Never    No family history on file.  No Known Allergies  Objective: Vitals:   01/16/22 1508  BP: 123/85  Pulse: 83  Resp: 16  SpO2: 100%  Weight: 125 lb 9.6 oz (57 kg)  Height: 5\' 8"  (1.727 m)   Body mass index is 19.1 kg/m.  Physical Exam Constitutional:      Comments: He is talkative and pleasant.  Abdominal:     General: There is no distension.     Palpations: Abdomen is soft.     Tenderness: There is abdominal tenderness.  Psychiatric:        Mood and Affect: Mood  normal.    Lab Results    Problem List Items Addressed This Visit       High   History of Helicobacter pylori infection    I have ordered Helicobacter pylori breath test today.  I will call him when I have the test results.  Addendum: His H. Pylori breath test remains positive. He will come in tomorrow morning to discuss a new treatment regimen. I plan on a quad regimen consisting of bismuth, metronidazole, tetracycline and a PPI for 14 days.      Relevant Orders   H. pylori breath test (Completed)     Cliffton Asters, MD [facility] [phone] pager   [phone] cell 01/17/2022, 5:02 PM

## 2022-01-17 ENCOUNTER — Ambulatory Visit (INDEPENDENT_AMBULATORY_CARE_PROVIDER_SITE_OTHER): Payer: Self-pay | Admitting: Physician Assistant

## 2022-01-17 ENCOUNTER — Encounter: Payer: Self-pay | Admitting: Physician Assistant

## 2022-01-17 ENCOUNTER — Other Ambulatory Visit: Payer: Self-pay

## 2022-01-17 DIAGNOSIS — J302 Other seasonal allergic rhinitis: Secondary | ICD-10-CM

## 2022-01-17 DIAGNOSIS — K219 Gastro-esophageal reflux disease without esophagitis: Secondary | ICD-10-CM

## 2022-01-17 DIAGNOSIS — F411 Generalized anxiety disorder: Secondary | ICD-10-CM

## 2022-01-17 LAB — H. PYLORI BREATH TEST: H. pylori Breath Test: DETECTED — AB

## 2022-01-17 MED ORDER — SERTRALINE HCL 50 MG PO TABS
50.0000 mg | ORAL_TABLET | Freq: Every day | ORAL | 1 refills | Status: DC
  Filled 2022-01-17: qty 30, 30d supply, fill #0
  Filled 2022-03-15: qty 30, 30d supply, fill #1

## 2022-01-17 MED ORDER — PANTOPRAZOLE SODIUM 40 MG PO TBEC
40.0000 mg | DELAYED_RELEASE_TABLET | Freq: Every day | ORAL | 1 refills | Status: DC
  Filled 2022-01-17: qty 30, 30d supply, fill #0

## 2022-01-17 MED ORDER — CETIRIZINE HCL 10 MG PO TABS
10.0000 mg | ORAL_TABLET | Freq: Every day | ORAL | 1 refills | Status: DC
  Filled 2022-01-17: qty 90, 90d supply, fill #0

## 2022-01-17 NOTE — Progress Notes (Signed)
Patient has eaten and taken medication today. ?Patient reports pain in the chest at a 5 related to acid reflux described as "something stuck after eating". ? ?

## 2022-01-17 NOTE — Progress Notes (Signed)
Established Patient Office Visit  Subjective:  Patient ID: Peter Snow, male    DOB: [birth year]  Age: 26 y.o. MRN: [id number]  CC:  Chief Complaint  Patient presents with   Follow-up    HPI Peter Snow reports that he has been taking the Zoloft as directed, states that he feels his anxiety level is much improved, also states that the "scared feeling" that he had is gone.  Reports his sleep is much improved, has not been using anything to help him with sleep.  Does endorse that he has been having some drowsiness during the day despite getting a good nights rest.  States that he is being seen by infectious disease, states that they had him hold his Protonix for 2 weeks so he could have follow-up testing for H. pylori, states he is still awaiting the results.  Reports that he has been having increased heartburn and acid reflux without taking the Protonix.  Request refill of Zyrtec.  Past Medical History:  Diagnosis Date   GERD (gastroesophageal reflux disease)    H. pylori infection    Sinus tachycardia     History reviewed. No pertinent surgical history.  History reviewed. No pertinent family history.  Social History   Socioeconomic History   Marital status: Single    Spouse name: Not on file   Number of children: Not on file   Years of education: Not on file   Highest education level: Not on file  Occupational History   Occupation: unemployed  Tobacco Use   Smoking status: Never   Smokeless tobacco: Never  Vaping Use   Vaping Use: Never used  Substance and Sexual Activity   Alcohol use: Never   Drug use: Never   Sexual activity: Not Currently  Other Topics Concern   Not on file  Social History Narrative   Not on file   Social Determinants of Health   Financial Resource Strain: Not on file  Food Insecurity: Not on file  Transportation Needs: Not on file  Physical Activity: Not on file  Stress: Not on file  Social Connections: Not on file   Intimate Partner Violence: Not on file    Outpatient Medications Prior to Visit  Medication Sig Dispense Refill   hydrOXYzine (ATARAX) 25 MG tablet Take 1 tablet (25 mg total) by mouth at bedtime as needed (insomnia). 30 tablet 1   metoprolol tartrate (LOPRESSOR) 25 MG tablet Take 1 tablet (25 mg total) by mouth 2 (two) times daily. 60 tablet 11   traZODone (DESYREL) 50 MG tablet Take 1 tablet (50 mg total) by mouth at bedtime as needed for sleep. 30 tablet 1   cetirizine (ZYRTEC ALLERGY) 10 MG tablet Take 1 tablet (10 mg total) by mouth daily. 30 tablet 11   sertraline (ZOLOFT) 50 MG tablet Take 1 tablet (50 mg total) by mouth daily. 30 tablet 3   nystatin cream (MYCOSTATIN) Apply to affected area 2 times daily x 7 days for fungal infection (Patient not taking: Reported on 12/04/2021) 30 g 0   ondansetron (ZOFRAN-ODT) 8 MG disintegrating tablet Take 1 tablet (8 mg total) by mouth every 8 (eight) hours as needed for nausea or vomiting. (Patient not taking: Reported on 12/04/2021) 20 tablet 0   polyethylene glycol powder (GLYCOLAX/MIRALAX) 17 GM/SCOOP powder Take 17 g by mouth daily. (Patient not taking: Reported on 12/04/2021) 3350 g 1   pantoprazole (PROTONIX) 40 MG tablet Take 1 tablet (40 mg total) by mouth daily. (Patient not taking: Reported on  01/17/2022) 30 tablet 3   No facility-administered medications prior to visit.    No Known Allergies  ROS Review of Systems  Constitutional: Negative.   HENT: Negative.    Eyes: Negative.   Respiratory:  Negative for shortness of breath.   Cardiovascular:  Negative for chest pain.  Gastrointestinal:  Negative for nausea and vomiting.  Endocrine: Negative.   Genitourinary: Negative.   Musculoskeletal: Negative.   Skin: Negative.   Allergic/Immunologic: Negative.   Neurological: Negative.   Hematological: Negative.   Psychiatric/Behavioral:  Negative for dysphoric mood, self-injury, sleep disturbance and suicidal ideas. The patient is not  nervous/anxious.      Objective:    Physical Exam Vitals and nursing note reviewed.  Constitutional:      Appearance: Normal appearance.  HENT:     Head: Normocephalic and atraumatic.     Right Ear: External ear normal.     Left Ear: External ear normal.     Nose: Nose normal.     Mouth/Throat:     Mouth: Mucous membranes are moist.     Pharynx: Oropharynx is clear.  Eyes:     Extraocular Movements: Extraocular movements intact.     Conjunctiva/sclera: Conjunctivae normal.     Pupils: Pupils are equal, round, and reactive to light.  Cardiovascular:     Rate and Rhythm: Normal rate and regular rhythm.     Pulses: Normal pulses.     Heart sounds: Normal heart sounds.  Pulmonary:     Effort: Pulmonary effort is normal.     Breath sounds: Normal breath sounds.  Musculoskeletal:        General: Normal range of motion.     Cervical back: Normal range of motion and neck supple.  Skin:    General: Skin is warm and dry.  Neurological:     General: No focal deficit present.     Mental Status: He is alert and oriented to person, place, and time.  Psychiatric:        Mood and Affect: Mood normal.        Behavior: Behavior normal.        Thought Content: Thought content normal.        Judgment: Judgment normal.    BP 126/81 (BP Location: Left Arm, Patient Position: Sitting, Cuff Size: Normal)    Pulse 94    Temp 98.2 F (36.8 C) (Oral)    Resp 18    Ht 5' 8.5" (1.74 m)    Wt 125 lb (56.7 kg)    SpO2 98%    BMI 18.73 kg/m  Wt Readings from Last 3 Encounters:  01/17/22 125 lb (56.7 kg)  01/16/22 125 lb 9.6 oz (57 kg)  01/02/22 125 lb 9.6 oz (57 kg)     Health Maintenance Due  Topic Date Due   COVID-19 Vaccine (1) Never done   HPV VACCINES (1 - Male 2-dose series) Never done   TETANUS/TDAP  Never done   INFLUENZA VACCINE  Never done       Topic Date Due   HPV VACCINES (1 - Male 2-dose series) Never done    Lab Results  Component Value Date   TSH 4.158 11/30/2021    Lab Results  Component Value Date   WBC 7.0 12/07/2021   HGB 14.0 12/07/2021   HCT 41.2 12/07/2021   MCV 82.1 12/07/2021   PLT 289 12/07/2021   Lab Results  Component Value Date   NA 139 12/07/2021   K 3.7 12/07/2021  CO2 26 12/07/2021   GLUCOSE 93 12/07/2021   BUN 14 12/07/2021   CREATININE 0.77 12/07/2021   BILITOT 0.9 11/30/2021   ALKPHOS 63 11/30/2021   AST 22 11/30/2021   ALT 16 11/30/2021   PROT 6.6 11/30/2021   ALBUMIN 4.4 11/30/2021   CALCIUM 9.3 12/07/2021   ANIONGAP 8 12/07/2021   Lab Results  Component Value Date   CHOL 104 11/15/2020   Lab Results  Component Value Date   HDL 46 11/15/2020   Lab Results  Component Value Date   LDLCALC 42 11/15/2020   Lab Results  Component Value Date   TRIG 77 11/15/2020   No results found for: CHOLHDL Lab Results  Component Value Date   HGBA1C 5.5 11/15/2020      Assessment & Plan:   Problem List Items Addressed This Visit       Digestive   GERD (gastroesophageal reflux disease)   Relevant Medications   pantoprazole (PROTONIX) 40 MG tablet     Other   Generalized anxiety disorder   Relevant Medications   sertraline (ZOLOFT) 50 MG tablet   Seasonal allergies   Relevant Medications   cetirizine (ZYRTEC ALLERGY) 10 MG tablet    Meds ordered this encounter  Medications   cetirizine (ZYRTEC ALLERGY) 10 MG tablet    Sig: Take 1 tablet (10 mg total) by mouth daily.    Dispense:  90 tablet    Refill:  1    Change to 90 day please    Order Specific Question:   Supervising Provider    Answer:   Storm Frisk [[id number]]   pantoprazole (PROTONIX) 40 MG tablet    Sig: Take 1 tablet (40 mg total) by mouth daily.    Dispense:  90 tablet    Refill:  1    Change to 90 day please    Order Specific Question:   Supervising Provider    Answer:   Storm Frisk [[id number]]   sertraline (ZOLOFT) 50 MG tablet    Sig: Take 1 tablet (50 mg total) by mouth daily.    Dispense:  90 tablet    Refill:  1     Change to 90 day please    Order Specific Question:   Supervising Provider    Answer:   Shan Levans E [[id number]]  1. Generalized anxiety disorder Patient encouraged to continue Zoloft, is taking at bedtime, does state somnolence during the day despite good night sleep, patient encouraged to continue monitoring this symptom.  Red flags given for prompt reevaluation.  Patient did request 90-day refill due to moving to [place].  Patient encouraged to find primary care provider in new area. - sertraline (ZOLOFT) 50 MG tablet; Take 1 tablet (50 mg total) by mouth daily.  Dispense: 90 tablet; Refill: 1  2. Gastroesophageal reflux disease without esophagitis Patient awaiting test of cure of H. pylori.  Patient requested 90-day refill of Protonix.  Patient encouraged to keep follow-up with infectious disease. - pantoprazole (PROTONIX) 40 MG tablet; Take 1 tablet (40 mg total) by mouth daily.  Dispense: 90 tablet; Refill: 1  3. Seasonal allergies Continue current regimen. - cetirizine (ZYRTEC ALLERGY) 10 MG tablet; Take 1 tablet (10 mg total) by mouth daily.  Dispense: 90 tablet; Refill: 1    I have reviewed the patient's medical history (PMH, PSH, Social History, Family History, Medications, and allergies) , and have been updated if relevant. I spent 20 minutes reviewing chart and  face to face time with  patient.    Follow-up: Return if symptoms worsen or fail to improve.    Peter Knudsen Mayers, PA-C

## 2022-01-17 NOTE — Patient Instructions (Signed)
Continue taking the Zoloft on a daily basis.  If you are still having drowsiness during the day after 2 more weeks, please feel free to return to the mobile unit for further evaluation and consideration of medication change. ? ?I encourage you to follow-up with infectious disease regarding your H. pylori test.  It is fine to resume your Protonix at this time. ? ?Please let Peter Snow know if there is any else we can do for you. ? ?Kennieth Rad, PA-C ?Physician Assistant ?[place] ?[email or web address] ? ? ?Health Maintenance, Male ?Adopting a healthy lifestyle and getting preventive care are important in promoting health and wellness. Ask your health care provider about: ?The right schedule for you to have regular tests and exams. ?Things you can do on your own to prevent diseases and keep yourself healthy. ?What should I know about diet, weight, and exercise? ?Eat a healthy diet ? ?Eat a diet that includes plenty of vegetables, fruits, low-fat dairy products, and lean protein. ?Do not eat a lot of foods that are high in solid fats, added sugars, or sodium. ?Maintain a healthy weight ?Body mass index (BMI) is a measurement that can be used to identify possible weight problems. It estimates body fat based on height and weight. Your health care provider can help determine your BMI and help you achieve or maintain a healthy weight. ?Get regular exercise ?Get regular exercise. This is one of the most important things you can do for your health. Most adults should: ?Exercise for at least 150 minutes each week. The exercise should increase your heart rate and make you sweat (moderate-intensity exercise). ?Do strengthening exercises at least twice a week. This is in addition to the moderate-intensity exercise. ?Spend less time sitting. Even light physical activity can be beneficial. ?Watch cholesterol and blood lipids ?Have your blood tested for lipids and cholesterol at  24 years of age, then have this test every 5 years. ?You may need to have your cholesterol levels checked more often if: ?Your lipid or cholesterol levels are high. ?You are older than 24 years of age. ?You are at high risk for heart disease. ?What should I know about cancer screening? ?Many types of cancers can be detected early and may often be prevented. Depending on your health history and family history, you may need to have cancer screening at various ages. This may include screening for: ?Colorectal cancer. ?Prostate cancer. ?Skin cancer. ?Lung cancer. ?What should I know about heart disease, diabetes, and high blood pressure? ?Blood pressure and heart disease ?High blood pressure causes heart disease and increases the risk of stroke. This is more likely to develop in people who have high blood pressure readings or are overweight. ?Talk with your health care provider about your target blood pressure readings. ?Have your blood pressure checked: ?Every 3-5 years if you are 39-7 years of age. ?Every year if you are 73 years old or older. ?If you are between the ages of 90 and 49 and are a current or former smoker, ask your health care provider if you should have a one-time screening for abdominal aortic aneurysm (AAA). ?Diabetes ?Have regular diabetes screenings. This checks your fasting blood sugar level. Have the screening done: ?Once every three years after age 54 if you are at a normal weight and have a low risk for diabetes. ?More often and at a younger age if you are overweight or have a high risk for diabetes. ?What should I know about preventing infection? ?Hepatitis B ?If  you have a higher risk for hepatitis B, you should be screened for this virus. Talk with your health care provider to find out if you are at risk for hepatitis B infection. ?Hepatitis C ?Blood testing is recommended for: ?Everyone born from [birth year] through 1963. ?Anyone with known risk factors for hepatitis C. ?Sexually transmitted  infections (STIs) ?You should be screened each year for STIs, including gonorrhea and chlamydia, if: ?You are sexually active and are younger than 24 years of age. ?You are older than 24 years of age and your health care provider tells you that you are at risk for this type of infection. ?Your sexual activity has changed since you were last screened, and you are at increased risk for chlamydia or gonorrhea. Ask your health care provider if you are at risk. ?Ask your health care provider about whether you are at high risk for HIV. Your health care provider may recommend a prescription medicine to help prevent HIV infection. If you choose to take medicine to prevent HIV, you should first get tested for HIV. You should then be tested every 3 months for as long as you are taking the medicine. ?Follow these instructions at home: ?Alcohol use ?Do not drink alcohol if your health care provider tells you not to drink. ?If you drink alcohol: ?Limit how much you have to 0-2 drinks a day. ?Know how much alcohol is in your drink. In the [place] one drink equals one 12 oz bottle of beer (355 mL), one 5 oz glass of wine (148 mL), or one 1? oz glass of hard liquor (44 mL). ?Lifestyle ?Do not use any products that contain nicotine or tobacco. These products include cigarettes, chewing tobacco, and vaping devices, such as e-cigarettes. If you need help quitting, ask your health care provider. ?Do not use street drugs. ?Do not share needles. ?Ask your health care provider for help if you need support or information about quitting drugs. ?General instructions ?Schedule regular health, dental, and eye exams. ?Stay current with your vaccines. ?Tell your health care provider if: ?You often feel depressed. ?You have ever been abused or do not feel safe at home. ?Summary ?Adopting a healthy lifestyle and getting preventive care are important in promoting health and wellness. ?Follow your health care provider's instructions about healthy  diet, exercising, and getting tested or screened for diseases. ?Follow your health care provider's instructions on monitoring your cholesterol and blood pressure. ?This information is not intended to replace advice given to you by your health care provider. Make sure you discuss any questions you have with your health care provider. ?Document Revised: 03/27/2021 Document Reviewed: 03/27/2021 ?Elsevier Patient Education ? [place]. ? ?

## 2022-01-18 ENCOUNTER — Other Ambulatory Visit (HOSPITAL_COMMUNITY): Payer: Self-pay

## 2022-01-18 ENCOUNTER — Ambulatory Visit (INDEPENDENT_AMBULATORY_CARE_PROVIDER_SITE_OTHER): Payer: Self-pay | Admitting: Pharmacist

## 2022-01-18 ENCOUNTER — Other Ambulatory Visit: Payer: Self-pay

## 2022-01-18 ENCOUNTER — Telehealth: Payer: Self-pay

## 2022-01-18 DIAGNOSIS — A048 Other specified bacterial intestinal infections: Secondary | ICD-10-CM

## 2022-01-18 MED ORDER — DOXYCYCLINE HYCLATE 100 MG PO TABS
100.0000 mg | ORAL_TABLET | Freq: Two times a day (BID) | ORAL | 0 refills | Status: AC
  Filled 2022-01-18: qty 28, 14d supply, fill #0

## 2022-01-18 MED ORDER — METRONIDAZOLE 500 MG PO TABS
500.0000 mg | ORAL_TABLET | Freq: Four times a day (QID) | ORAL | 0 refills | Status: AC
  Filled 2022-01-18: qty 56, 14d supply, fill #0

## 2022-01-18 MED ORDER — PANTOPRAZOLE SODIUM 40 MG PO TBEC
40.0000 mg | DELAYED_RELEASE_TABLET | Freq: Two times a day (BID) | ORAL | 0 refills | Status: DC
  Filled 2022-01-18 (×2): qty 28, 14d supply, fill #0

## 2022-01-18 MED ORDER — BISMUTH SUBSALICYLATE 262 MG PO CHEW
524.0000 mg | CHEWABLE_TABLET | Freq: Four times a day (QID) | ORAL | 0 refills | Status: AC
  Filled 2022-01-18: qty 112, 14d supply, fill #0

## 2022-01-18 NOTE — Telephone Encounter (Signed)
[facility] Patient Advocate Encounter ? ?Insurance verification completed.   ? ?The patient is uninsured and will need patient assistance for medication. ? ?We can complete the application and will need to meet with the patient for signatures and income documentation. ? ?Railey Glad, CPhT ?Specialty Pharmacy Patient Advocate ?[facility] ?Phone: [phone] ?Fax:  [phone]  ?

## 2022-01-18 NOTE — Patient Instructions (Addendum)
It was great seeing you today! ? ?Please take the following for 14 days: ? ?Bismuth subsalicylate tablets - 2 tablets (524 mg) by mouth FOUR times per day ?Metronidazole - 1 tablet (500 mg) by mouth FOUR times per day ?Doxycycline - 1 tablet (100 mg) by mouth TWO times per day ?Pantoprazole - 1 tablet (40 mg) by mouth TWO times per day ? ?Please call if you have any questions or concerns! ?[phone] ? ?Monee Dembeck L. Franki Alcaide, PharmD ?[facility] Clinical Pharmacist Practitioner ? ?

## 2022-01-18 NOTE — Progress Notes (Signed)
? ?01/18/2022 ? ?HPI: Peter Snow is a 26 y.o. male who presents to the [facility] clinic today to follow up for his persistent Helicobacter pylori infection. ? ?Patient Active Problem List  ? Diagnosis Date Noted  ? 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 Generalized anxiety disorder 12/21/2021  ? Psychophysiological insomnia 12/21/2021  ? Seasonal allergies 12/21/2021  ? Sinus tachycardia 12/04/2021  ? History of Helicobacter pylori infection 10/31/2021  ? GERD (gastroesophageal reflux disease) 10/31/2021  ? Hx of gonorrhea 05/2020  ? Eye pain, bilateral 04/2020  ? ? ?Patient's Medications  ?New Prescriptions  ? BISMUTH SUBSALICYLATE (PEPTO BISMOL) 262 MG CHEWABLE TABLET    Chew 2 tablets (524 mg total) by mouth in the morning, at noon, in the evening, and at bedtime for 14 days.  ? DOXYCYCLINE (VIBRA-TABS) 100 MG TABLET    Take 1 tablet (100 mg total) by mouth 2 (two) times daily for 14 days.  ? METRONIDAZOLE (FLAGYL) 500 MG TABLET    Take 1 tablet (500 mg total) by mouth 4 (four) times daily for 14 days.  ? PANTOPRAZOLE (PROTONIX) 40 MG TABLET    Take 1 tablet (40 mg total) by mouth 2 (two) times daily for 14 days.  ?Previous Medications  ? CETIRIZINE (ZYRTEC ALLERGY) 10 MG TABLET    Take 1 tablet (10 mg total) by mouth daily.  ? HYDROXYZINE (ATARAX) 25 MG TABLET    Take 1 tablet (25 mg total) by mouth at bedtime as needed (insomnia).  ? METOPROLOL TARTRATE (LOPRESSOR) 25 MG TABLET    Take 1 tablet (25 mg total) by mouth 2 (two) times daily.  ? NYSTATIN CREAM (MYCOSTATIN)    Apply to affected area 2 times daily x 7 days for fungal infection  ? ONDANSETRON (ZOFRAN-ODT) 8 MG DISINTEGRATING TABLET    Take 1 tablet (8 mg total) by mouth every 8 (eight) hours as needed for nausea or vomiting.  ? PANTOPRAZOLE (PROTONIX) 40 MG TABLET    Take 1 tablet (40 mg total) by mouth daily.  ? POLYETHYLENE GLYCOL POWDER (GLYCOLAX/MIRALAX) 17 GM/SCOOP POWDER    Take 17 g by mouth daily.  ? SERTRALINE (ZOLOFT) 50 MG TABLET     Take 1 tablet (50 mg total) by mouth daily.  ? TRAZODONE (DESYREL) 50 MG TABLET    Take 1 tablet (50 mg total) by mouth at bedtime as needed for sleep.  ?Modified Medications  ? No medications on file  ?Discontinued Medications  ? No medications on file  ? ? ?Allergies: ?No Known Allergies ? ?Past Medical History: ?Past Medical History:  ?Diagnosis Date  ? GERD (gastroesophageal reflux disease)   ? H. pylori infection   ? Sinus tachycardia   ? ? ?Social History: ?Social History  ? ?Socioeconomic History  ? Marital status: Single  ?  Spouse name: Not on file  ? Number of children: Not on file  ? Years of education: Not on file  ? Highest education level: Not on file  ?Occupational History  ? Occupation: unemployed  ?Tobacco Use  ? Smoking status: Never  ? Smokeless tobacco: Never  ?Vaping Use  ? Vaping Use: Never used  ?Substance and Sexual Activity  ? Alcohol use: Never  ? Drug use: Never  ? Sexual activity: Not Currently  ?Other Topics Concern  ? Not on file  ?Social History Narrative  ? Not on file  ? ?Social Determinants of Health  ? ?Financial Resource Strain: Not on file  ?Food Insecurity: Not on file  ?Transportation  Needs: Not on file  ?Physical Activity: Not on file  ?Stress: Not on file  ?Social Connections: Not on file  ? ? ?Assessment: ?Peter Snow is here today to follow up for his persistent Helicobacter pylori gastritis infection. He previously failed macrolide-based therapy and had a positive breath test earlier this week. Dr. Megan Salon would like to start him on bismuth-based therapy to see if this will eradicate his infection.  Met with patient today to go over medications. He is uninsured but the antibiotics are relatively cheap and cost-effective for him. [place] has all of them in stock, so I will send prescriptions there and he will go pick them up after our visit.  ? ?Counseled to take chewable bismuth subsalicylate 932 mg - 2 tablets FOUR times per day. Counseled on the  possibility of developing blackening of the tongue on treatment. The time of the day he wakes up and goes to sleep each day varies greatly so counseled him to make sure to get ~6 hours between doses. Also counseled on doxycycline administration - one tablet (100 mg) TWO times per day. Advised on the possibility of photosensitivity and the need to wear long sleeves or sunscreen during the 2 weeks of therapy. Asked that he take it with food to mitigate any nausea that can occur. Also advised to sit up straight and take with a full glass of water to avoid throat irritation. Counseled on metronidazole therapy - take one tablet (500 mg) FOUR times per day. Advised to avoid alcohol while taking and for 2-3 days after completing therapy. Lastly, counseled on pantoprazole therapy, which he has taken before.  ? ?Sent all prescriptions to [facility]. Also wrote instructions on AVS. He will follow up with Dr. Megan Salon at the end of the month for a repeat breath test. He will call with any questions or concerns. His understanding of the regimen seemed very good. Answered all questions.  ? ?Plan: ?- Metronidazole 500 mg PO QID ?- Bismuth subsalicylate 671 mg PO QID ?- Doxycycline 100 mg PO BID ?- Pantoprazole 40 mg PO BID ?- Take for 14 days ?- F/u with Dr. Megan Salon on 3/29 at 9am ?- Call with any questions/concerns ? ?Thadius Smisek L. Jud Fanguy, PharmD, BCIDP, AAHIVP, CPP ?Clinical Pharmacist Practitioner ?Infectious Diseases Clinical Pharmacist ?[facility] ?01/18/2022, 10:36 AM ? ? ?

## 2022-02-13 ENCOUNTER — Other Ambulatory Visit: Payer: Self-pay

## 2022-02-13 ENCOUNTER — Encounter: Payer: Self-pay | Admitting: Family Medicine

## 2022-02-13 ENCOUNTER — Ambulatory Visit: Payer: Self-pay | Attending: Family Medicine | Admitting: Family Medicine

## 2022-02-13 VITALS — BP 127/84 | HR 79 | Ht 68.5 in | Wt 125.4 lb

## 2022-02-13 DIAGNOSIS — G4709 Other insomnia: Secondary | ICD-10-CM

## 2022-02-13 DIAGNOSIS — Z13228 Encounter for screening for other metabolic disorders: Secondary | ICD-10-CM

## 2022-02-13 DIAGNOSIS — J392 Other diseases of pharynx: Secondary | ICD-10-CM

## 2022-02-13 DIAGNOSIS — Z8619 Personal history of other infectious and parasitic diseases: Secondary | ICD-10-CM

## 2022-02-13 DIAGNOSIS — F064 Anxiety disorder due to known physiological condition: Secondary | ICD-10-CM

## 2022-02-13 NOTE — Patient Instructions (Signed)

## 2022-02-13 NOTE — Progress Notes (Signed)
? ?Subjective:  ?Patient ID: Peter Snow, male    DOB: [birth year]  Age: 26 y.o. MRN: [id number] ? ?CC: Abdominal Pain ? ? ?HPI ?Peter CapriceWaheedullah Ramseur is a 26 y.o. year old male with a history of anxiety, insomnia who presents today for an office visit. ?Accompanied by an in person interpreter. ? ?Interval History: ?Today he complains of abdominal pain.  Review of his chart indicates treatment for H. pylori gastritis. ?He was seen at infectious disease with plans to follow-up on his H. pylori gastritis in 1 month.  He received a prescription for doxycycline, pantoprazole, bismuth subsalicylate cleat, metronidazole on 01/18/2022 after he had filled macrolide based therapy. ?He is fasting as it is the month of Ramaddan and he is not drinking any water. Even when it is time to break the fast he does not feel like eating and just drinks juice.  Complains that he is losing weight. ?Symptoms have been present for 3-4 months with associated nausea but no vomiting ? ?He has pain on both sides of his neck for the last 1 week with burning sensation intermittently then goes on to describe this as dryness in his throat.  His med list reveals he was prescribed Zyrtec which he has not been taking ?He also has some insomnia and was prescribed hydroxyzine but states he sometimes has a hangover with hydroxyzine. ?He would like to have his vitamin D level checked. ?Past Medical History:  ?Diagnosis Date  ? GERD (gastroesophageal reflux disease)   ? H. pylori infection   ? Sinus tachycardia   ? ? ?History reviewed. No pertinent surgical history. ? ?History reviewed. No pertinent family history. ? ?Social History  ? ?Socioeconomic History  ? Marital status: Single  ?  Spouse name: Not on file  ? Number of children: Not on file  ? Years of education: Not on file  ? Highest education level: Not on file  ?Occupational History  ? Occupation: unemployed  ?Tobacco Use  ? Smoking status: Never  ? Smokeless tobacco: Never  ?Vaping Use  ? Vaping  Use: Never used  ?Substance and Sexual Activity  ? Alcohol use: Never  ? Drug use: Never  ? Sexual activity: Not Currently  ?Other Topics Concern  ? Not on file  ?Social History Narrative  ? Not on file  ? ?Social Determinants of Health  ? ?Financial Resource Strain: Not on file  ?Food Insecurity: Not on file  ?Transportation Needs: Not on file  ?Physical Activity: Not on file  ?Stress: Not on file  ?Social Connections: Not on file  ? ? ?No Known Allergies ? ?Outpatient Medications Prior to Visit  ?Medication Sig Dispense Refill  ? cetirizine (ZYRTEC ALLERGY) 10 MG tablet Take 1 tablet (10 mg total) by mouth daily. 90 tablet 1  ? hydrOXYzine (ATARAX) 25 MG tablet Take 1 tablet (25 mg total) by mouth at bedtime as needed (insomnia). 30 tablet 1  ? metoprolol tartrate (LOPRESSOR) 25 MG tablet Take 1 tablet (25 mg total) by mouth 2 (two) times daily. 60 tablet 11  ? nystatin cream (MYCOSTATIN) Apply to affected area 2 times daily x 7 days for fungal infection 30 g 0  ? ondansetron (ZOFRAN-ODT) 8 MG disintegrating tablet Take 1 tablet (8 mg total) by mouth every 8 (eight) hours as needed for nausea or vomiting. 20 tablet 0  ? pantoprazole (PROTONIX) 40 MG tablet Take 1 tablet (40 mg total) by mouth daily. 90 tablet 1  ? polyethylene glycol powder (GLYCOLAX/MIRALAX) 17 GM/SCOOP powder Take  17 g by mouth daily. 3350 g 1  ? sertraline (ZOLOFT) 50 MG tablet Take 1 tablet (50 mg total) by mouth daily. 90 tablet 1  ? traZODone (DESYREL) 50 MG tablet Take 1 tablet (50 mg total) by mouth at bedtime as needed for sleep. 30 tablet 1  ? pantoprazole (PROTONIX) 40 MG tablet Take 1 tablet by mouth 2 times daily for 14 days. 28 tablet 0  ? ?No facility-administered medications prior to visit.  ? ? ? ?ROS ?Review of Systems  ?Constitutional:  Negative for activity change and appetite change.  ?HENT:  Negative for sinus pressure and sore throat.   ?Eyes:  Negative for visual disturbance.  ?Respiratory:  Negative for cough, chest  tightness and shortness of breath.   ?Cardiovascular:  Negative for chest pain and leg swelling.  ?Gastrointestinal:  Positive for abdominal pain. Negative for abdominal distention, constipation and diarrhea.  ?Endocrine: Negative.   ?Genitourinary:  Negative for dysuria.  ?Musculoskeletal:  Negative for joint swelling and myalgias.  ?Skin:  Negative for rash.  ?Allergic/Immunologic: Negative.   ?Neurological:  Negative for weakness, light-headedness and numbness.  ?Psychiatric/Behavioral:  Negative for dysphoric mood and suicidal ideas.   ? ?Objective:  ?BP 127/84   Pulse 79   Ht 5' 8.5" (1.74 m)   Wt 125 lb 6.4 oz (56.9 kg)   SpO2 100%   BMI 18.79 kg/m?  ? ? ?  02/13/2022  ?  1:40 PM 01/17/2022  ?  1:34 PM 01/16/2022  ?  3:08 PM  ?BP/Weight  ?Systolic BP 127 126 123  ?Diastolic BP 84 81 85  ?Wt. (Lbs) 125.4 125 125.6  ?BMI 18.79 kg/m2 18.73 kg/m2 19.1 kg/m2  ? ? ? ? ?Physical Exam ?Constitutional:   ?   Appearance: He is well-developed.  ?HENT:  ?   Mouth/Throat:  ?   Mouth: Mucous membranes are moist.  ?   Comments: Slight oropharyngeal erythema ?Cardiovascular:  ?   Rate and Rhythm: Normal rate.  ?   Heart sounds: Normal heart sounds. No murmur heard. ?Pulmonary:  ?   Effort: Pulmonary effort is normal.  ?   Breath sounds: Normal breath sounds. No wheezing or rales.  ?Chest:  ?   Chest wall: No tenderness.  ?Abdominal:  ?   General: Abdomen is flat. Bowel sounds are normal. There is no distension.  ?   Palpations: Abdomen is soft. There is no mass.  ?   Tenderness: There is no abdominal tenderness.  ?Musculoskeletal:     ?   General: Normal range of motion.  ?   Right lower leg: No edema.  ?   Left lower leg: No edema.  ?Neurological:  ?   Mental Status: He is alert and oriented to person, place, and time.  ?Psychiatric:     ?   Mood and Affect: Mood normal.  ? ? ? ?  Latest Ref Rng & Units 12/07/2021  ? 10:43 PM 11/30/2021  ?  2:52 AM 10/15/2021  ?  9:16 PM  ?CMP  ?Glucose 70 - 99 mg/dL 93   852   91    ?BUN 6 -  20 mg/dL 14   8   10     ?Creatinine 0.61 - 1.24 mg/dL   7.78   2.42    ?Sodium 135 - 145 mmol/L 139   138   139    ?Potassium 3.5 - 5.1 mmol/L 3.7   3.4   4.0    ?Chloride 98 - 111  mmol/L 105   102   103    ?CO2 22 - 32 mmol/L 26   26   30     ?Calcium 8.9 - 10.3 mg/dL 9.3   9.3   9.9    ?Total Protein 6.5 - 8.1 g/dL  6.6   7.0    ?Total Bilirubin 0.3 - 1.2 mg/dL  0.9   1.6    ?Alkaline Phos 38 - 126 U/L  63   66    ?AST 15 - 41 U/L  22   16    ?ALT 0 - 44 U/L  16   22    ? ? ?Lipid Panel  ?   ?Component Value Date/Time  ? CHOL 104 11/15/2020 0950  ? TRIG 77 11/15/2020 0950  ? HDL 46 11/15/2020 0950  ? LDLCALC 42 11/15/2020 0950  ? ? ?CBC ?   ?Component Value Date/Time  ? WBC 7.0 12/07/2021 2243  ? RBC 5.02 12/07/2021 2243  ? HGB 14.0 12/07/2021 2243  ? HGB 16.2 11/15/2020 0950  ? HCT 41.2 12/07/2021 2243  ? HCT 47.6 11/15/2020 0950  ? PLT 289 12/07/2021 2243  ? PLT 304 11/15/2020 0950  ? MCV 82.1 12/07/2021 2243  ? MCV 85 11/15/2020 0950  ? MCH 27.9 12/07/2021 2243  ? MCHC 34.0 12/07/2021 2243  ? RDW 12.3 12/07/2021 2243  ? RDW 12.3 11/15/2020 0950  ? LYMPHSABS 3.8 11/30/2021 0252  ? LYMPHSABS 3.6 (H) 11/15/2020 0950  ? MONOABS 0.8 11/30/2021 0252  ? EOSABS 0.3 11/30/2021 0252  ? EOSABS 0.3 11/15/2020 0950  ? BASOSABS 0.1 11/30/2021 0252  ? BASOSABS 0.0 11/15/2020 0950  ? ? ?Lab Results  ?Component Value Date  ? HGBA1C 5.5 11/15/2020  ? ? ?Assessment & Plan:  ?1. Other insomnia ?He does have some degree of anxiety which is exacerbating his insomnia ?Prescribed hydroxyzine but this causes a hangover ?Counseled on sleep hygiene and advised he can use melatonin instead ? ?2. History of Helicobacter pylori infection ?Completed course of treatment ?He will follow-up with infectious disease in 1 month for H. pylori testing ? ?3. Dry throat ?He does have a prescription for Zyrtec at the pharmacy and has been advised to pick this up ? ?4. Screening for metabolic disorder ?- VITAMIN D 25 Hydroxy (Vit-D Deficiency,  Fractures) ? ?5. Anxiety disorder due to known physiological condition ?Currently on an SSRI and hydroxyzine ? ? ?No orders of the defined types were placed in this encounter. ? ? ?Follow-up: Return in about 3 month

## 2022-02-14 ENCOUNTER — Other Ambulatory Visit: Payer: Self-pay | Admitting: Family Medicine

## 2022-02-14 ENCOUNTER — Other Ambulatory Visit: Payer: Self-pay

## 2022-02-14 ENCOUNTER — Ambulatory Visit (HOSPITAL_COMMUNITY): Payer: Medicaid Other | Admitting: Licensed Clinical Social Worker

## 2022-02-14 ENCOUNTER — Ambulatory Visit (INDEPENDENT_AMBULATORY_CARE_PROVIDER_SITE_OTHER): Payer: Self-pay | Admitting: Internal Medicine

## 2022-02-14 ENCOUNTER — Encounter: Payer: Self-pay | Admitting: Internal Medicine

## 2022-02-14 DIAGNOSIS — Z8619 Personal history of other infectious and parasitic diseases: Secondary | ICD-10-CM

## 2022-02-14 LAB — VITAMIN D 25 HYDROXY (VIT D DEFICIENCY, FRACTURES): Vit D, 25-Hydroxy: 12 ng/mL — ABNORMAL LOW (ref 30.0–100.0)

## 2022-02-14 MED ORDER — ERGOCALCIFEROL 1.25 MG (50000 UT) PO CAPS
50000.0000 [IU] | ORAL_CAPSULE | ORAL | 0 refills | Status: DC
  Filled 2022-02-14: qty 12, 84d supply, fill #0
  Filled 2022-02-22: qty 4, 28d supply, fill #0

## 2022-02-14 NOTE — Assessment & Plan Note (Signed)
He had very good, prompt improvement in his acid reflux symptoms on treatment for Helicobacter pylori.  I will get a breath test today to see if his infection has been cured. ?

## 2022-02-14 NOTE — Progress Notes (Signed)
?  ? ? ? ? ?[facility] ? ?Patient Active Problem List  ? Diagnosis Date Noted  ? History of Helicobacter pylori infection 10/31/2021  ?  Priority: High  ? 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 Generalized anxiety disorder 12/21/2021  ? Psychophysiological insomnia 12/21/2021  ? Seasonal allergies 12/21/2021  ? Sinus tachycardia 12/04/2021  ? GERD (gastroesophageal reflux disease) 10/31/2021  ? Hx of gonorrhea 05/2020  ? Eye pain, bilateral 04/2020  ? ? ?Patient's Medications  ?New Prescriptions  ? No medications on file  ?Previous Medications  ? CETIRIZINE (ZYRTEC ALLERGY) 10 MG TABLET    Take 1 tablet (10 mg total) by mouth daily.  ? ERGOCALCIFEROL (DRISDOL) 1.25 MG (50000 UT) CAPSULE    Take 1 capsule (50,000 Units total) by mouth once a week.  ? HYDROXYZINE (ATARAX) 25 MG TABLET    Take 1 tablet (25 mg total) by mouth at bedtime as needed (insomnia).  ? METOPROLOL TARTRATE (LOPRESSOR) 25 MG TABLET    Take 1 tablet (25 mg total) by mouth 2 (two) times daily.  ? NYSTATIN CREAM (MYCOSTATIN)    Apply to affected area 2 times daily x 7 days for fungal infection  ? ONDANSETRON (ZOFRAN-ODT) 8 MG DISINTEGRATING TABLET    Take 1 tablet (8 mg total) by mouth every 8 (eight) hours as needed for nausea or vomiting.  ? PANTOPRAZOLE (PROTONIX) 40 MG TABLET    Take 1 tablet (40 mg total) by mouth daily.  ? PANTOPRAZOLE (PROTONIX) 40 MG TABLET    Take 1 tablet by mouth 2 times daily for 14 days.  ? POLYETHYLENE GLYCOL POWDER (GLYCOLAX/MIRALAX) 17 GM/SCOOP POWDER    Take 17 g by mouth daily.  ? SERTRALINE (ZOLOFT) 50 MG TABLET    Take 1 tablet (50 mg total) by mouth daily.  ? TRAZODONE (DESYREL) 50 MG TABLET    Take 1 tablet (50 mg total) by mouth at bedtime as needed for sleep.  ?Modified Medications  ? No medications on file  ?Discontinued Medications  ? No medications on file  ? ? ?Subjective: ?Peter Snow is in for his routine  follow-up visit.  He started bismuth, metronidazole, tetracycline and pantoprazole on 01/18/2022.  He took this regimen for 14 days.  He did stop his pantoprazole at the end of his therapy.  He had a little bit of diarrhea and nausea while on treatment.  The symptoms are slowly getting better.  He feels like his appetite is off slightly but this is difficult to gauge since he is currently fasting for Ramadan.  His acid reflux symptoms are significantly improved as is his abdominal pain. ? ?Review of Systems: ?Review of Systems  ?Constitutional:  Negative for fever.  ?Gastrointestinal:  Positive for diarrhea, heartburn and nausea. Negative for abdominal pain and vomiting.  ? ?Past Medical History:  ?Diagnosis Date  ? GERD (gastroesophageal reflux disease)   ? H. pylori infection   ? Sinus tachycardia   ? ? ?Social History  ? ?Tobacco Use  ? Smoking status: Never  ? Smokeless tobacco: Never  ?Vaping Use  ? Vaping Use: Never used  ?Substance Use Topics  ? Alcohol use: Never  ? Drug use: Never  ? ? ?No family history on file. ? ?No Known Allergies ? ?Objective: ?Vitals:  ? 02/14/22 0911  ?BP: 123/80  ?Pulse: 83  ?Temp: 97.8 ?F (36.6 ?C)  ?TempSrc: Temporal  ?Weight: 124 lb (56.2 kg)  ? ?  Body mass index is 18.58 kg/m?. ? ?Physical Exam ?Constitutional:   ?   Comments: He is smiling and in good spirits.  He was examined with the aid of the [place] interpreter.  ?Cardiovascular:  ?   Rate and Rhythm: Normal rate.  ?Pulmonary:  ?   Effort: Pulmonary effort is normal.  ?Abdominal:  ?   General: Abdomen is flat.  ?   Palpations: Abdomen is soft.  ?   Tenderness: There is no abdominal tenderness.  ?Psychiatric:     ?   Mood and Affect: Mood normal.  ? ? ?Lab Results ? ?  ?Problem List Items Addressed This Visit   ? ?  ? High  ? History of Helicobacter pylori infection  ?  He had very good, prompt improvement in his acid reflux symptoms on treatment for Helicobacter pylori.  I will get a breath test today to see if his infection has  been cured. ?  ?  ? Relevant Orders  ? H. pylori breath test  ? ? ? ?Cliffton Asters, MD ?[facility] ?[facility] ?[phone] pager   [phone] cell ?02/14/2022, 9:29 AM ?

## 2022-02-15 ENCOUNTER — Telehealth: Payer: Self-pay

## 2022-02-15 ENCOUNTER — Ambulatory Visit (HOSPITAL_COMMUNITY): Payer: Self-pay | Admitting: Physician Assistant

## 2022-02-15 LAB — H. PYLORI BREATH TEST: H. pylori Breath Test: DETECTED — AB

## 2022-02-15 NOTE — Telephone Encounter (Signed)
I spoke to the patient and relayed lab results regarding his positive H Pylori results and patient will need to follow up to discuss additional treatment per Dr. Megan Salon. Patient verbalized understanding and would like to follow up next week.  ?Peter Snow T Peter Snow ? ?

## 2022-02-21 ENCOUNTER — Other Ambulatory Visit: Payer: Self-pay

## 2022-02-22 ENCOUNTER — Ambulatory Visit (INDEPENDENT_AMBULATORY_CARE_PROVIDER_SITE_OTHER): Payer: Self-pay | Admitting: Internal Medicine

## 2022-02-22 ENCOUNTER — Encounter: Payer: Self-pay | Admitting: Internal Medicine

## 2022-02-22 ENCOUNTER — Other Ambulatory Visit: Payer: Self-pay

## 2022-02-22 DIAGNOSIS — A048 Other specified bacterial intestinal infections: Secondary | ICD-10-CM

## 2022-02-22 DIAGNOSIS — Z8619 Personal history of other infectious and parasitic diseases: Secondary | ICD-10-CM

## 2022-02-22 MED ORDER — AMOXICILLIN 250 MG PO CAPS
750.0000 mg | ORAL_CAPSULE | Freq: Three times a day (TID) | ORAL | 0 refills | Status: DC
  Filled 2022-02-22: qty 84, 10d supply, fill #0

## 2022-02-22 MED ORDER — PANTOPRAZOLE SODIUM 40 MG PO TBEC
40.0000 mg | DELAYED_RELEASE_TABLET | Freq: Two times a day (BID) | ORAL | 0 refills | Status: DC
  Filled 2022-02-22: qty 28, 14d supply, fill #0

## 2022-02-22 MED ORDER — LEVOFLOXACIN 500 MG PO TABS
500.0000 mg | ORAL_TABLET | Freq: Every day | ORAL | 0 refills | Status: DC
  Filled 2022-02-22: qty 14, 14d supply, fill #0

## 2022-02-22 NOTE — Progress Notes (Signed)
?  ? ? ? ? ?[facility] ? ?Patient Active Problem List  ? Diagnosis Date Noted  ? History of Helicobacter pylori infection 10/31/2021  ?  Priority: High  ? 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 Generalized anxiety disorder 12/21/2021  ? Psychophysiological insomnia 12/21/2021  ? Seasonal allergies 12/21/2021  ? Sinus tachycardia 12/04/2021  ? GERD (gastroesophageal reflux disease) 10/31/2021  ? Hx of gonorrhea 05/2020  ? Eye pain, bilateral 04/2020  ? ? ?Patient's Medications  ?New Prescriptions  ? AMOXICILLIN (AMOXIL) 250 MG CAPSULE    Take 3 capsules (750 mg total) by mouth 3 (three) times daily.  ? LEVOFLOXACIN (LEVAQUIN) 500 MG TABLET    Take 1 tablet (500 mg total) by mouth daily.  ?Previous Medications  ? CETIRIZINE (ZYRTEC ALLERGY) 10 MG TABLET    Take 1 tablet (10 mg total) by mouth daily.  ? ERGOCALCIFEROL (DRISDOL) 1.25 MG (50000 UT) CAPSULE    Take 1 capsule (50,000 Units total) by mouth once a week.  ? HYDROXYZINE (ATARAX) 25 MG TABLET    Take 1 tablet (25 mg total) by mouth at bedtime as needed (insomnia).  ? METOPROLOL TARTRATE (LOPRESSOR) 25 MG TABLET    Take 1 tablet (25 mg total) by mouth 2 (two) times daily.  ? NYSTATIN CREAM (MYCOSTATIN)    Apply to affected area 2 times daily x 7 days for fungal infection  ? ONDANSETRON (ZOFRAN-ODT) 8 MG DISINTEGRATING TABLET    Take 1 tablet (8 mg total) by mouth every 8 (eight) hours as needed for nausea or vomiting.  ? POLYETHYLENE GLYCOL POWDER (GLYCOLAX/MIRALAX) 17 GM/SCOOP POWDER    Take 17 g by mouth daily.  ? SERTRALINE (ZOLOFT) 50 MG TABLET    Take 1 tablet (50 mg total) by mouth daily.  ? TRAZODONE (DESYREL) 50 MG TABLET    Take 1 tablet (50 mg total) by mouth at bedtime as needed for sleep.  ?Modified Medications  ? Modified Medication Previous Medication  ? PANTOPRAZOLE (PROTONIX) 40 MG TABLET pantoprazole (PROTONIX) 40 MG tablet  ?    Take 1 tablet by mouth 2  times daily for 14 days.    Take 1 tablet by mouth 2 times daily for 14 days.  ?Discontinued Medications  ? PANTOPRAZOLE (PROTONIX) 40 MG TABLET    Take 1 tablet (40 mg total) by mouth daily.  ? ? ?Subjective: ?Peter Snow is in for his routine follow-up visit.  He was diagnosed with Helicobacter pylori gastritis several years ago.  He underwent treatment with clarithromycin, amoxicillin and a PPI in October of last year but his follow-up Helicobacter breath test was positive.  I treated him again last month with bismuth, metronidazole, tetracycline and pantoprazole.  His acid reflux symptoms improved significantly but his repeat breath test several weeks ago remained positive.  He is still complaining of poor appetite.  He is still somewhat anxious.  He is fasting for Ramadan. ? ?Review of Systems: ?Review of Systems  ?Constitutional:  Positive for malaise/fatigue. Negative for fever and weight loss.  ?Gastrointestinal:  Positive for abdominal pain and heartburn. Negative for diarrhea, nausea and vomiting.  ?Psychiatric/Behavioral:  Negative for depression. The patient is nervous/anxious.   ? ?Past Medical History:  ?Diagnosis Date  ? GERD (gastroesophageal reflux disease)   ? H. pylori infection   ? Sinus tachycardia   ? ? ?Social History  ? ?Tobacco Use  ? Smoking status: Never  ? Smokeless tobacco:  Never  ?Vaping Use  ? Vaping Use: Never used  ?Substance Use Topics  ? Alcohol use: Never  ? Drug use: Never  ? ? ?No family history on file. ? ?No Known Allergies ? ?Objective: ?Vitals:  ? 02/22/22 0850  ?BP: 115/78  ?Pulse: 76  ?Temp: (!) 97.3 ?F (36.3 ?C)  ?TempSrc: Oral  ?SpO2: 99%  ?Weight: 126 lb (57.2 kg)  ?Height: 5\' 8"  (1.727 m)  ? ?Body mass index is 19.16 kg/m?. ? ?Physical Exam ?Constitutional:   ?   Comments: His weight is unchanged.  He appears worried.  ?Cardiovascular:  ?   Rate and Rhythm: Normal rate and regular rhythm.  ?   Heart sounds: No murmur heard. ?Pulmonary:  ?   Effort: Pulmonary effort is  normal.  ?   Breath sounds: Normal breath sounds.  ?Abdominal:  ?   Palpations: Abdomen is soft.  ?   Tenderness: There is abdominal tenderness.  ?   Comments: He has epigastric tenderness with palpation.  ?Psychiatric:     ?   Mood and Affect: Mood normal.  ? ? ?Lab Results ? ?  ?Problem List Items Addressed This Visit   ? ?  ? High  ? History of Helicobacter pylori infection  ?  I will treat him with a salvage regimen of levofloxacin, amoxicillin and twice daily pantoprazole.  I have instructed him to stop all 3 medications after 14 days.  He will need a follow-up Helicobacter pylori breath test in 4 weeks.  He tells me that he may find a provider near his home in [place], [place] to order the test. ?  ?  ? Relevant Medications  ? levofloxacin (LEVAQUIN) 500 MG tablet  ? amoxicillin (AMOXIL) 250 MG capsule  ? ?Other Visit Diagnoses   ? ? Helicobacter pylori infection      ? Relevant Medications  ? pantoprazole (PROTONIX) 40 MG tablet  ? ?  ? ? ? ?[place], MD ?[facility] ?[facility] ?[phone] pager   [phone] cell ?02/22/2022, 9:21 AM ?

## 2022-02-22 NOTE — Assessment & Plan Note (Signed)
I will treat him with a salvage regimen of levofloxacin, amoxicillin and twice daily pantoprazole.  I have instructed him to stop all 3 medications after 14 days.  He will need a follow-up Helicobacter pylori breath test in 4 weeks.  He tells me that he may find a provider near his home in [place], [place] to order the test. ?

## 2022-03-15 ENCOUNTER — Other Ambulatory Visit: Payer: Self-pay

## 2022-03-15 ENCOUNTER — Encounter (HOSPITAL_BASED_OUTPATIENT_CLINIC_OR_DEPARTMENT_OTHER): Payer: Self-pay | Admitting: Emergency Medicine

## 2022-03-15 ENCOUNTER — Emergency Department (HOSPITAL_BASED_OUTPATIENT_CLINIC_OR_DEPARTMENT_OTHER)
Admission: EM | Admit: 2022-03-15 | Discharge: 2022-03-15 | Disposition: A | Discharge status: Home / Self Care | Payer: Medicaid Other | Attending: Emergency Medicine | Admitting: Emergency Medicine

## 2022-03-15 DIAGNOSIS — R202 Paresthesia of skin: Secondary | ICD-10-CM | POA: Insufficient documentation

## 2022-03-15 DIAGNOSIS — R630 Anorexia: Secondary | ICD-10-CM | POA: Insufficient documentation

## 2022-03-15 DIAGNOSIS — R197 Diarrhea, unspecified: Secondary | ICD-10-CM | POA: Insufficient documentation

## 2022-03-15 DIAGNOSIS — R110 Nausea: Secondary | ICD-10-CM | POA: Insufficient documentation

## 2022-03-15 DIAGNOSIS — Z5321 Procedure and treatment not carried out due to patient leaving prior to being seen by health care provider: Secondary | ICD-10-CM | POA: Insufficient documentation

## 2022-03-15 LAB — CBC
HCT: 45.4 % (ref 39.0–52.0)
Hemoglobin: 15.2 g/dL (ref 13.0–17.0)
MCH: 27.7 pg (ref 26.0–34.0)
MCHC: 33.5 g/dL (ref 30.0–36.0)
MCV: 82.8 fL (ref 80.0–100.0)
Platelets: 327 10*3/uL (ref 150–400)
RBC: 5.48 MIL/uL (ref 4.22–5.81)
RDW: 12.6 % (ref 11.5–15.5)
WBC: 6.2 10*3/uL (ref 4.0–10.5)
nRBC: 0 % (ref 0.0–0.2)

## 2022-03-15 LAB — COMPREHENSIVE METABOLIC PANEL
ALT: 20 U/L (ref 0–44)
AST: 20 U/L (ref 15–41)
Albumin: 5.1 g/dL — ABNORMAL HIGH (ref 3.5–5.0)
Alkaline Phosphatase: 82 U/L (ref 38–126)
Anion gap: 10 (ref 5–15)
BUN: 9 mg/dL (ref 6–20)
CO2: 30 mmol/L (ref 22–32)
Calcium: 10.3 mg/dL (ref 8.9–10.3)
Chloride: 97 mmol/L — ABNORMAL LOW (ref 98–111)
Creatinine, Ser: 0.63 mg/dL (ref 0.61–1.24)
GFR, Estimated: >60 mL/min (ref >60)
Glucose, Bld: 109 mg/dL — ABNORMAL HIGH (ref 70–99)
Potassium: 3.9 mmol/L (ref 3.5–5.1)
Sodium: 137 mmol/L (ref 135–145)
Total Bilirubin: 0.8 mg/dL (ref 0.3–1.2)
Total Protein: 8.3 g/dL — ABNORMAL HIGH (ref 6.5–8.1)

## 2022-03-15 LAB — URINALYSIS, ROUTINE W REFLEX MICROSCOPIC
Bilirubin Urine: NEGATIVE
Glucose, UA: NEGATIVE mg/dL
Hgb urine dipstick: NEGATIVE
Ketones, ur: NEGATIVE mg/dL
Leukocytes,Ua: NEGATIVE
Nitrite: NEGATIVE
Protein, ur: NEGATIVE mg/dL
Specific Gravity, Urine: <1.005 — ABNORMAL LOW (ref 1.005–1.030)
pH: 7.5 (ref 5.0–8.0)

## 2022-03-15 LAB — LIPASE, BLOOD: Lipase: 17 U/L (ref 11–51)

## 2022-03-15 NOTE — ED Provider Notes (Signed)
?[facility] ?Provider Note ? ? ?CSN: [id number] ?Arrival date & time: 03/15/22  1210 ? ?  ? ?History ? ?Chief Complaint  ?Patient presents with  ? Tingling  ? ? ?Peter Snow is a 26 y.o. male with chief complaint of body tingling.  Hx of H. pylori, GERD, generalized anxiety disorder, seasonal allergies, latent TB.  Also complains of loss of appetite and nasal congestion.  I signed up for the patient to provide care.  Before I was able to initiate care for the patient, he left AGAINST MEDICAL ADVICE. ? ?The history is provided by medical records.  ? ?  ? ?Home Medications ?Prior to Admission medications   ?Medication Sig Start Date End Date Taking? Authorizing Provider  ?amoxicillin (AMOXIL) 250 MG capsule Take 3 capsules (750 mg total) by mouth 3 (three) times daily. 02/22/22   Cliffton Asters, MD  ?cetirizine (ZYRTEC ALLERGY) 10 MG tablet Take 1 tablet (10 mg total) by mouth daily. ?Patient not taking: Reported on 02/14/2022 01/17/22   Mayers, Cari S, PA-C  ?ergocalciferol (DRISDOL) 1.25 MG (50000 UT) capsule Take 1 capsule (50,000 Units total) by mouth once a week. ?Patient not taking: Reported on 02/14/2022 02/14/22   Hoy Register, MD  ?hydrOXYzine (ATARAX) 25 MG tablet Take 1 tablet (25 mg total) by mouth at bedtime as needed (insomnia). ?Patient not taking: Reported on 02/14/2022 11/15/21   Hoy Register, MD  ?levofloxacin (LEVAQUIN) 500 MG tablet Take 1 tablet (500 mg total) by mouth daily. 02/22/22   Cliffton Asters, MD  ?metoprolol tartrate (LOPRESSOR) 25 MG tablet Take 1 tablet (25 mg total) by mouth 2 (two) times daily. 12/04/21 12/04/22  Nahser, Deloris Ping, MD  ?nystatin cream (MYCOSTATIN) Apply to affected area 2 times daily x 7 days for fungal infection ?Patient not taking: Reported on 02/14/2022 11/15/21   Hoy Register, MD  ?ondansetron (ZOFRAN-ODT) 8 MG disintegrating tablet Take 1 tablet (8 mg total) by mouth every 8 (eight) hours as needed for nausea or vomiting. ?Patient  not taking: Reported on 02/14/2022 10/24/21   Mayers, Cari S, PA-C  ?pantoprazole (PROTONIX) 40 MG tablet Take 1 tablet by mouth 2 times daily for 14 days. 02/22/22 03/08/22  Cliffton Asters, MD  ?polyethylene glycol powder (GLYCOLAX/MIRALAX) 17 GM/SCOOP powder Take 17 g by mouth daily. ?Patient not taking: Reported on 02/22/2022 11/15/21   Hoy Register, MD  ?sertraline (ZOLOFT) 50 MG tablet Take 1 tablet (50 mg total) by mouth daily. 01/17/22   Mayers, Cari S, PA-C  ?traZODone (DESYREL) 50 MG tablet Take 1 tablet (50 mg total) by mouth at bedtime as needed for sleep. ?Patient not taking: Reported on 02/14/2022 12/20/21   Mayers, Kasandra Knudsen, PA-C  ?   ? ?Allergies    ?Patient has no known allergies.   ? ?Review of Systems   ?Review of Systems  ?Unable to perform ROS: Other (Patient left AMA)  ? ?Physical Exam ?Updated Vital Signs ?BP (!) 147/86 (BP Location: Right Arm)   Pulse 90   Temp 97.7 ?F (36.5 ?C)   Resp 16   Ht 5\' 8"  (1.727 m)   Wt 57.6 kg   SpO2 100%   BMI 19.31 kg/m?  ?Physical Exam ? ?ED Results / Procedures / Treatments   ?Labs ?(all labs ordered are listed, but only abnormal results are displayed) ?Labs Reviewed - No data to display ? ?EKG ?None ? ?Radiology ?No results found. ? ?Procedures ?Procedures  ? ? ?Medications Ordered in ED ?Medications - No data to display ? ?  ED Course/ Medical Decision Making/ A&P ?  ?                        ?Medical Decision Making ?Amount and/or Complexity of Data Reviewed ?External Data Reviewed: notes. ?Labs:  Decision-making details documented in ED Course. ?Radiology:  Decision-making details documented in ED Course. ?ECG/medicine tests:  Decision-making details documented in ED Course. ? ? ?26 y.o. male presents to the ED for concern of Tingling.  This involves an extensive number of treatment options, and is a complaint that carries with it a high risk of complications and morbidity. ? ?Past Medical History / Co-morbidities / Social History: ?Hx of H. pylori, GERD,  generalized anxiety disorder, seasonal allergies, latent TB ? ?Additional History:  ?Internal and external records from outside source obtained and reviewed including ED visits, infectious disease, internal medicine, family medicine ? ?Physical Exam: ?Unable to perform physical exam as the patient left prior to evaluation. ? ?Lab Tests: ?I considered ordering labs, but the patient left prior to evaluation. ? ?Imaging Studies: ?None ? ?Medications: ?None ? ?ED Course/Disposition: ?I signed up for the patient to provide care.  Before I was able to initiate care for the patient, he left AGAINST MEDICAL ADVICE. ? ?I discussed the patient and their case with my attending, Dr. Renaye Rakers, who is aware that the patient is left AGAINST MEDICAL ADVICE. ? ?This chart was dictated using voice recognition software.  Despite best efforts to proofread,  errors can occur which can change the documentation meaning. ? ? ? ? ? ? ? ? ?Final Clinical Impression(s) / ED Diagnoses ?Final diagnoses:  ?Tingling  ? ? ?Rx / DC Orders ?ED Discharge Orders   ? ? None  ? ?  ? ? ?  ?Cecil Cobbs, PA-C ?03/17/22 3762 ? ?  ?Terald Sleeper, MD ?03/19/22 1426 ? ?

## 2022-03-15 NOTE — ED Triage Notes (Signed)
C/o of diarrhea  and loss of appetite after taking milk of magnesia  for some epigastric pain and heartburn earlier in the day. Pt stated he then took imodium but still has diarrhea. Pt stated he had about more than 10 bowel movement today. Denies abdominal pain or nausea.  ?

## 2022-03-15 NOTE — ED Notes (Signed)
Pt walked out of room, states he is leaving, pt not in any distress. ?

## 2022-03-15 NOTE — ED Triage Notes (Signed)
Pt c/o of loss of appetite and burning and tingling in his head for the past 3 days. That also radiates to his legs and all over his body. Pt also reports pressure in his nose and sinus. Pt states that he has not eaten in 3 days because he is not hungry. ?

## 2022-03-16 ENCOUNTER — Emergency Department (HOSPITAL_BASED_OUTPATIENT_CLINIC_OR_DEPARTMENT_OTHER)
Admission: EM | Admit: 2022-03-16 | Discharge: 2022-03-16 | Disposition: A | Discharge status: Home / Self Care | Payer: Medicaid Other | Attending: Emergency Medicine | Admitting: Emergency Medicine

## 2022-03-16 DIAGNOSIS — R197 Diarrhea, unspecified: Secondary | ICD-10-CM

## 2022-03-16 DIAGNOSIS — R202 Paresthesia of skin: Secondary | ICD-10-CM

## 2022-03-16 MED ORDER — SODIUM CHLORIDE 0.9 % IV BOLUS
1000.0000 mL | Freq: Once | INTRAVENOUS | Status: AC
  Administered 2022-03-16: 1000 mL via INTRAVENOUS

## 2022-03-16 MED ORDER — KETOROLAC TROMETHAMINE 30 MG/ML IJ SOLN
30.0000 mg | Freq: Once | INTRAMUSCULAR | Status: AC
  Administered 2022-03-16: 30 mg via INTRAVENOUS
  Filled 2022-03-16: qty 1

## 2022-03-16 MED ORDER — ONDANSETRON HCL 4 MG/2ML IJ SOLN
4.0000 mg | Freq: Once | INTRAMUSCULAR | Status: AC
  Administered 2022-03-16: 4 mg via INTRAVENOUS
  Filled 2022-03-16: qty 2

## 2022-03-16 NOTE — ED Provider Notes (Signed)
?[facility] ?Provider Note ? ? ?CSN: [id number] ?Arrival date & time: 03/15/22  2133 ? ?  ? ?History ? ?Chief Complaint  ?Patient presents with  ? Diarrhea  ? ? ?Peter Snow is a 26 y.o. male. ? ?Patient is a 26 year old male with history of generalized anxiety disorder, H. pylori, sinus tachycardia.  Patient presenting today with complaints of diarrhea and decreased appetite.  This has been worsening over the past 2 days.  He denies to me he is having any abdominal pain, fevers, or chills.  He denies any ill contacts.  Patient was just seen here earlier this afternoon with complaints of "tingling". ? ?The history is provided by the patient.  ? ?  ? ?Home Medications ?Prior to Admission medications   ?Medication Sig Start Date End Date Taking? Authorizing Provider  ?amoxicillin (AMOXIL) 250 MG capsule Take 3 capsules (750 mg total) by mouth 3 (three) times daily. 02/22/22   Cliffton Asters, MD  ?cetirizine (ZYRTEC ALLERGY) 10 MG tablet Take 1 tablet (10 mg total) by mouth daily. ?Patient not taking: Reported on 02/14/2022 01/17/22   Mayers, Cari S, PA-C  ?ergocalciferol (DRISDOL) 1.25 MG (50000 UT) capsule Take 1 capsule (50,000 Units total) by mouth once a week. ?Patient not taking: Reported on 02/14/2022 02/14/22   Hoy Register, MD  ?hydrOXYzine (ATARAX) 25 MG tablet Take 1 tablet (25 mg total) by mouth at bedtime as needed (insomnia). ?Patient not taking: Reported on 02/14/2022 11/15/21   Hoy Register, MD  ?levofloxacin (LEVAQUIN) 500 MG tablet Take 1 tablet (500 mg total) by mouth daily. 02/22/22   Cliffton Asters, MD  ?metoprolol tartrate (LOPRESSOR) 25 MG tablet Take 1 tablet (25 mg total) by mouth 2 (two) times daily. 12/04/21 12/04/22  Nahser, Deloris Ping, MD  ?nystatin cream (MYCOSTATIN) Apply to affected area 2 times daily x 7 days for fungal infection ?Patient not taking: Reported on 02/14/2022 11/15/21   Hoy Register, MD  ?ondansetron (ZOFRAN-ODT) 8 MG disintegrating tablet Take  1 tablet (8 mg total) by mouth every 8 (eight) hours as needed for nausea or vomiting. ?Patient not taking: Reported on 02/14/2022 10/24/21   Mayers, Cari S, PA-C  ?pantoprazole (PROTONIX) 40 MG tablet Take 1 tablet by mouth 2 times daily for 14 days. 02/22/22 03/08/22  Cliffton Asters, MD  ?polyethylene glycol powder (GLYCOLAX/MIRALAX) 17 GM/SCOOP powder Take 17 g by mouth daily. ?Patient not taking: Reported on 02/22/2022 11/15/21   Hoy Register, MD  ?sertraline (ZOLOFT) 50 MG tablet Take 1 tablet (50 mg total) by mouth daily. 01/17/22   Mayers, Cari S, PA-C  ?traZODone (DESYREL) 50 MG tablet Take 1 tablet (50 mg total) by mouth at bedtime as needed for sleep. ?Patient not taking: Reported on 02/14/2022 12/20/21   Mayers, Kasandra Knudsen, PA-C  ?   ? ?Allergies    ?Patient has no known allergies.   ? ?Review of Systems   ?Review of Systems  ?All other systems reviewed and are negative. ? ?Physical Exam ?Updated Vital Signs ?BP 132/85 (BP Location: Right Arm)   Pulse 89   Temp 98.2 ?F (36.8 ?C)   Resp 16   Ht 5\' 8"  (1.727 m)   Wt 57.6 kg   SpO2 100%   BMI 19.31 kg/m?  ?Physical Exam ?Vitals and nursing note reviewed.  ?Constitutional:   ?   General: He is not in acute distress. ?   Appearance: He is well-developed. He is not diaphoretic.  ?HENT:  ?   Head: Normocephalic and atraumatic.  ?  Cardiovascular:  ?   Rate and Rhythm: Normal rate and regular rhythm.  ?   Heart sounds: No murmur heard. ?  No friction rub.  ?Pulmonary:  ?   Effort: Pulmonary effort is normal. No respiratory distress.  ?   Breath sounds: Normal breath sounds. No wheezing or rales.  ?Abdominal:  ?   General: Bowel sounds are normal. There is no distension.  ?   Palpations: Abdomen is soft.  ?   Tenderness: There is no abdominal tenderness.  ?Musculoskeletal:     ?   General: Normal range of motion.  ?   Cervical back: Normal range of motion and neck supple.  ?Skin: ?   General: Skin is warm and dry.  ?Neurological:  ?   Mental Status: He is alert and  oriented to person, place, and time.  ?   Coordination: Coordination normal.  ? ? ?ED Results / Procedures / Treatments   ?Labs ?(all labs ordered are listed, but only abnormal results are displayed) ?Labs Reviewed  ?COMPREHENSIVE METABOLIC PANEL - Abnormal; Notable for the following components:  ?    Result Value  ? Chloride 97 (*)   ? Glucose, Bld 109 (*)   ? Total Protein 8.3 (*)   ? Albumin 5.1 (*)   ? All other components within normal limits  ?URINALYSIS, ROUTINE W REFLEX MICROSCOPIC - Abnormal; Notable for the following components:  ? Color, Urine COLORLESS (*)   ? Specific Gravity, Urine <1.005 (*)   ? All other components within normal limits  ?LIPASE, BLOOD  ?CBC  ? ? ?EKG ?None ? ?Radiology ?No results found. ? ?Procedures ?Procedures  ? ? ?Medications Ordered in ED ?Medications  ?sodium chloride 0.9 % bolus 1,000 mL (has no administration in time range)  ?ondansetron (ZOFRAN) injection 4 mg (has no administration in time range)  ?ketorolac (TORADOL) 30 MG/ML injection 30 mg (has no administration in time range)  ? ? ?ED Course/ Medical Decision Making/ A&P ? ?Patient is a 26 year old male presenting with multiple complaints including tingling in his head and nose, diarrhea, nausea, decreased appetite, and feeling as if there is something poking in his throat. ? ?Patient's vital signs are stable and laboratory studies are all normal.  His physical examination is unremarkable.  I see no physical findings of concern.  Patient was hydrated and given medication for what I suspect is a gastroenteritis given his reports of diarrhea. ? ?At this point I feel as though patient can safely be discharged.  He is clinically well-appearing and suitable for follow-up with his primary doctor. ? ?Final Clinical Impression(s) / ED Diagnoses ?Final diagnoses:  ?None  ? ? ?Rx / DC Orders ?ED Discharge Orders   ? ? None  ? ?  ? ? ?  ?Geoffery Lyons, MD ?03/16/22 0309 ? ?

## 2022-03-16 NOTE — Discharge Instructions (Addendum)
Continue medications as previously prescribed. ° °Follow-up with your primary doctor if symptoms are not improving in the next few days. °

## 2022-04-16 NOTE — Progress Notes (Deleted)
Cardiology Office Note    Date:  04/16/2022   ID:  Peter Snow, DOB May 20, 1996, MRN [id number]  PCP:  Pcp, No  Cardiologist:  Kristeen Miss, MD  Electrophysiologist:  None   Chief Complaint: ***  History of Present Illness:   Peter Snow is a 26 y.o. male with history of generalized anxiety disorder, GERD, sinus tachycardia, inactive TB who presents for follow-up. He was referred to Dr. Elease Hashimoto in 11/2021 after ER visit demonstrated sinus tachycardia in the 120s with no other arrhythmia. He had been started on metoprolol. He also reported a history of inactive TB when he arrived to the [place]. CXR 11/2021 was normal. There was a soft murmur on exam. Dr. Elease Hashimoto recommended echo which showed EF 60-65%, no significant abnormalities. Several ER visits for various symptoms reviewed from this year.  Sinus tachycardia History of tuberculosis Systolic murmur   Labwork independently reviewed: 02/2022 CBC wnl, K 3.9, Cr 0.63, LFTs ok 11/2021 TSH 4.158, Mg 1.8  Cardiology Studies:   Studies reviewed are outlined and summarized above. Reports included below if pertinent.   2d echo 11/2021   1. Left ventricular ejection fraction, by estimation, is 60 to 65%. The  left ventricle has normal function. The left ventricle has no regional  wall motion abnormalities. Left ventricular diastolic parameters were  normal.   2. Right ventricular systolic function is normal. The right ventricular  size is normal. Tricuspid regurgitation signal is inadequate for assessing  PA pressure.   3. The mitral valve is normal in structure. No evidence of mitral valve  regurgitation. No evidence of mitral stenosis.   4. The aortic valve is tricuspid. Aortic valve regurgitation is not  visualized. No aortic stenosis is present.   5. The inferior vena cava is normal in size with greater than 50%  respiratory variability, suggesting right atrial pressure of 3 mmHg.   Comparison(s): No prior Echocardiogram.     Past Medical History:  Diagnosis Date   GERD (gastroesophageal reflux disease)    H. pylori infection    Sinus tachycardia     No past surgical history on file.  Current Medications: No outpatient medications have been marked as taking for the 04/17/22 encounter (Appointment) with Laurann Montana, PA-C.   ***   Allergies:   Patient has no known allergies.   Social History   Socioeconomic History   Marital status: Single    Spouse name: Not on file   Number of children: Not on file   Years of education: Not on file   Highest education level: Not on file  Occupational History   Occupation: unemployed  Tobacco Use   Smoking status: Never   Smokeless tobacco: Never  Vaping Use   Vaping Use: Never used  Substance and Sexual Activity   Alcohol use: Never   Drug use: Never   Sexual activity: Not Currently  Other Topics Concern   Not on file  Social History Narrative   Not on file   Social Determinants of Health   Financial Resource Strain: Not on file  Food Insecurity: Not on file  Transportation Needs: Not on file  Physical Activity: Not on file  Stress: Not on file  Social Connections: Not on file     Family History:  The patient's ***family history is not on file.  ROS:   Please see the history of present illness. Otherwise, review of systems is positive for ***.  All other systems are reviewed and otherwise negative.  EKG(s)/Additional Labs   EKG:  EKG is ordered today, personally reviewed, demonstrating ***  Recent Labs: 11/30/2021: Magnesium 1.8; TSH 4.158 03/15/2022: ALT 20; BUN 9; Creatinine, Ser 0.63; Hemoglobin 15.2; Platelets 327; Potassium 3.9; Sodium 137  Recent Lipid Panel    Component Value Date/Time   CHOL 104 11/15/2020 0950   TRIG 77 11/15/2020 0950   HDL 46 11/15/2020 0950   LDLCALC 42 11/15/2020 0950    PHYSICAL EXAM:    VS:  There were no vitals taken for this visit.  BMI: There is no height or weight on file to calculate  BMI.  GEN: Well nourished, well developed male in no acute distress HEENT: normocephalic, atraumatic Neck: no JVD, carotid bruits, or masses Cardiac: ***RRR; no murmurs, rubs, or gallops, no edema  Respiratory:  clear to auscultation bilaterally, normal work of breathing GI: soft, nontender, nondistended, + BS MS: no deformity or atrophy Skin: warm and dry, no rash Neuro:  Alert and Oriented x 3, Strength and sensation are intact, follows commands Psych: euthymic mood, full affect  Wt Readings from Last 3 Encounters:  03/15/22 127 lb (57.6 kg)  03/15/22 127 lb (57.6 kg)  02/22/22 126 lb (57.2 kg)     ASSESSMENT & PLAN:   ***     Disposition: F/u with ***   Medication Adjustments/Labs and Tests Ordered: Current medicines are reviewed at length with the patient today.  Concerns regarding medicines are outlined above. Medication changes, Labs and Tests ordered today are summarized above and listed in the Patient Instructions accessible in Encounters.   Signed, Laurann Montana, PA-C  04/16/2022 9:22 AM    [facility] Phone: [phone]; Fax: [phone]

## 2022-04-17 ENCOUNTER — Ambulatory Visit: Payer: Self-pay | Admitting: Physician Assistant

## 2022-04-17 DIAGNOSIS — R000 Tachycardia, unspecified: Secondary | ICD-10-CM

## 2022-04-17 DIAGNOSIS — R011 Cardiac murmur, unspecified: Secondary | ICD-10-CM

## 2022-04-17 DIAGNOSIS — Z8611 Personal history of tuberculosis: Secondary | ICD-10-CM

## 2022-08-30 IMAGING — DX DG CHEST 2V
2 series · 2 of 2 positions shown · non-contrast
Comparison: September 01, 2021

CLINICAL DATA: Racing heart rate.

EXAM:
CHEST - 2 VIEW

[chest pa]
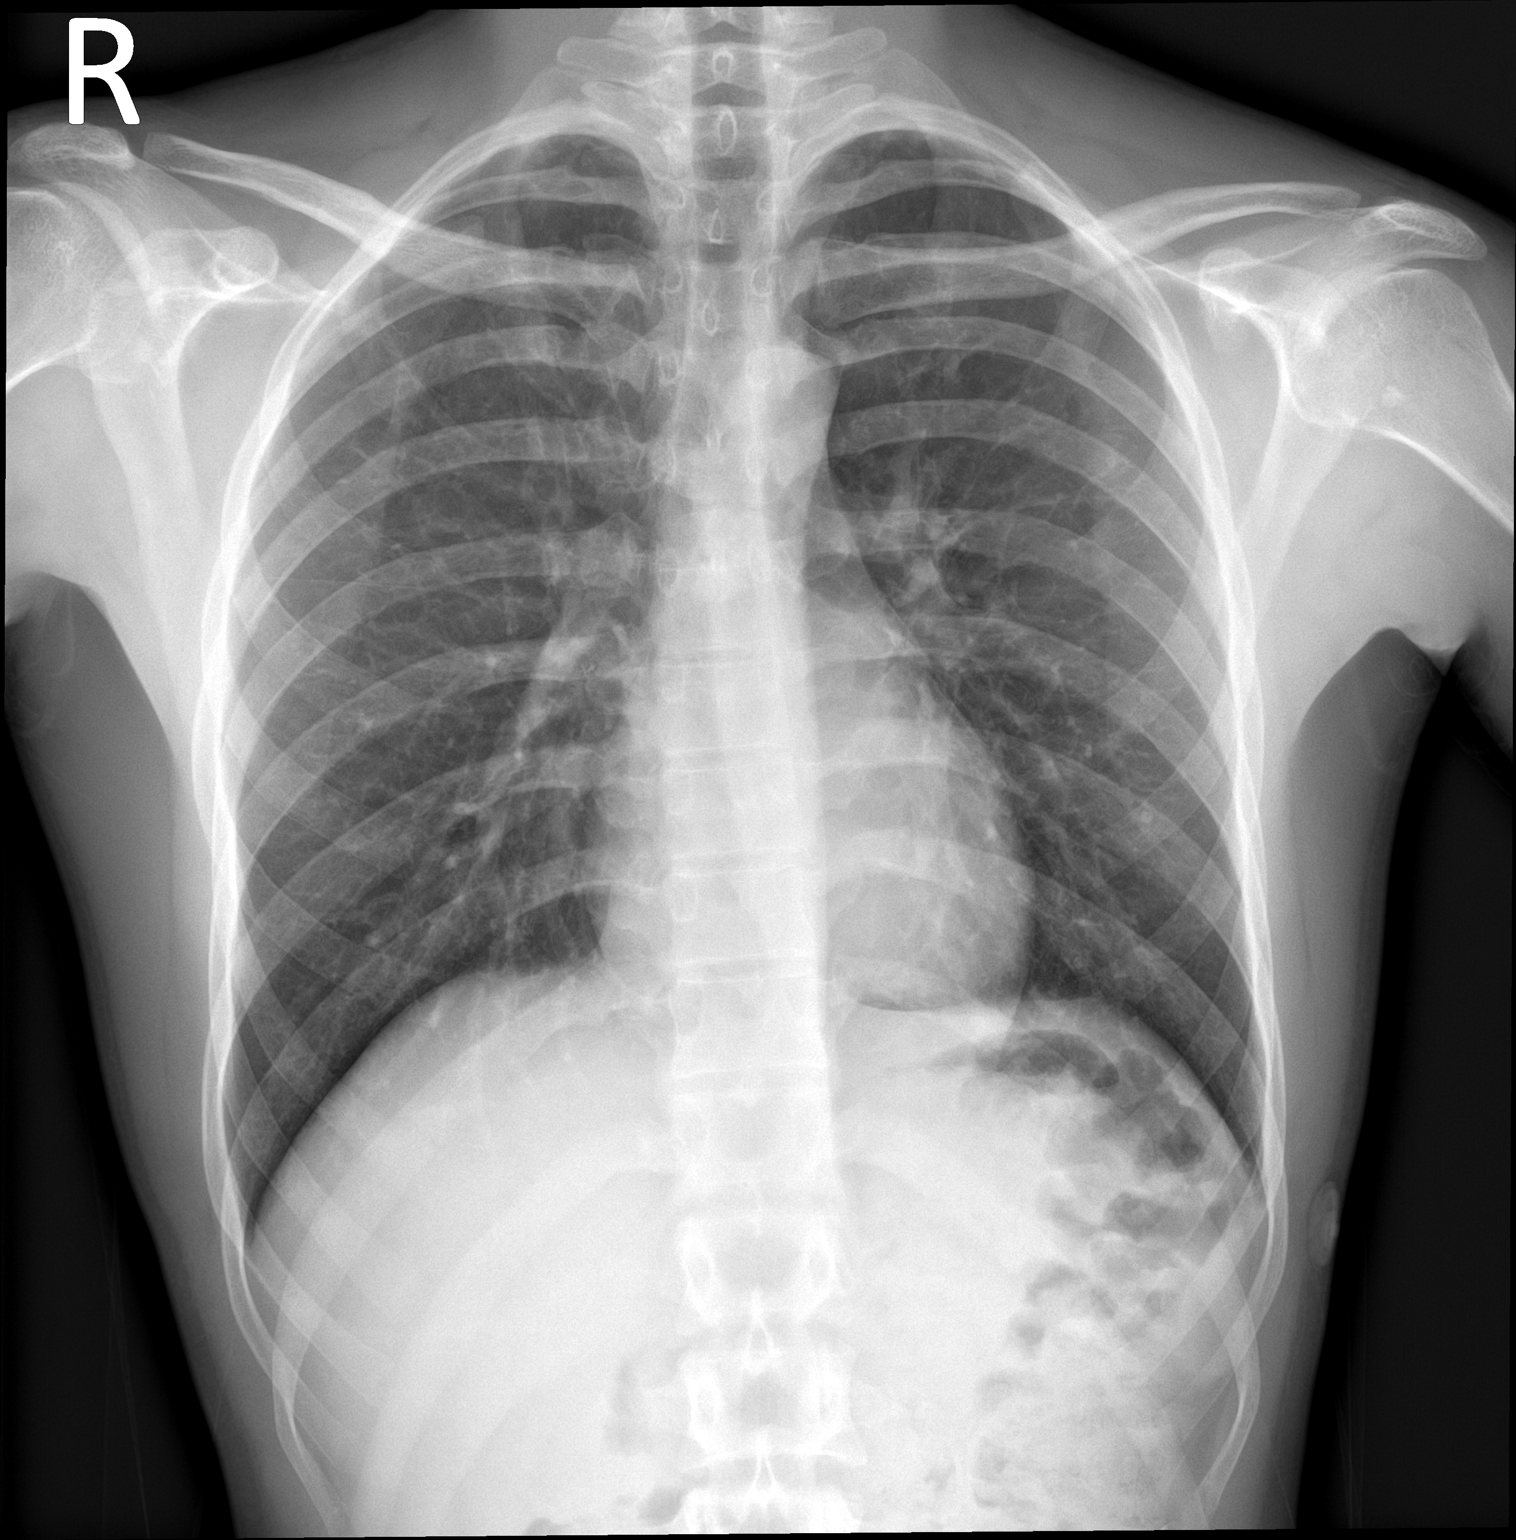

[chest lat]
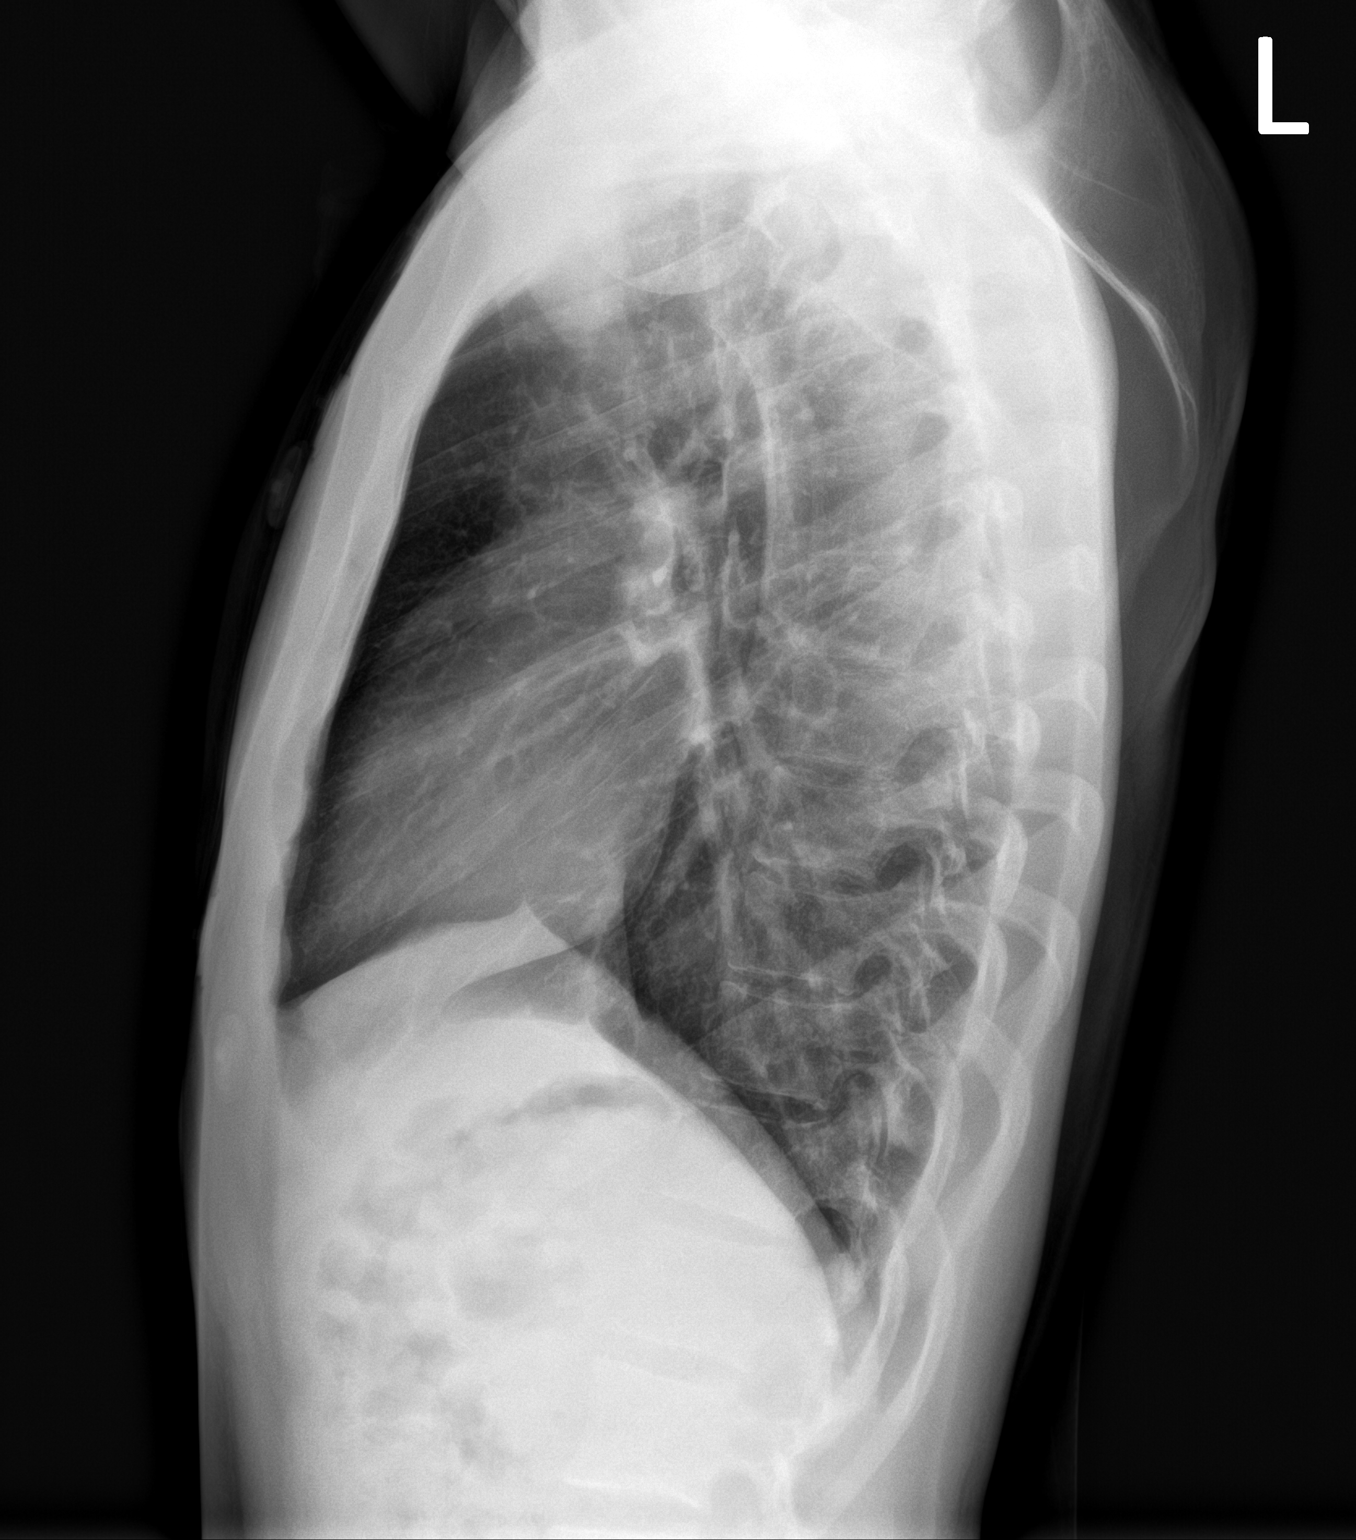

[2 of 2 positions shown; findings below may reference images not displayed]

FINDINGS: The heart size and mediastinal contours are within normal limits.
Both lungs are clear. The visualized skeletal structures are
unremarkable.
IMPRESSION: No active cardiopulmonary disease.

## 2022-12-28 ENCOUNTER — Other Ambulatory Visit: Payer: Self-pay | Admitting: Cardiovascular Disease

## 2023-03-19 ENCOUNTER — Other Ambulatory Visit: Payer: Self-pay

## 2023-03-19 ENCOUNTER — Encounter: Payer: Self-pay | Admitting: Physician Assistant

## 2023-03-19 ENCOUNTER — Ambulatory Visit: Payer: Medicaid Other | Admitting: Physician Assistant

## 2023-03-19 VITALS — BP 124/69 | HR 100 | Ht 67.0 in | Wt 126.0 lb

## 2023-03-19 DIAGNOSIS — K219 Gastro-esophageal reflux disease without esophagitis: Secondary | ICD-10-CM | POA: Diagnosis not present

## 2023-03-19 DIAGNOSIS — F411 Generalized anxiety disorder: Secondary | ICD-10-CM | POA: Diagnosis not present

## 2023-03-19 DIAGNOSIS — R000 Tachycardia, unspecified: Secondary | ICD-10-CM

## 2023-03-19 DIAGNOSIS — F5104 Psychophysiologic insomnia: Secondary | ICD-10-CM | POA: Diagnosis not present

## 2023-03-19 MED ORDER — TRAZODONE HCL 50 MG PO TABS
50.0000 mg | ORAL_TABLET | Freq: Every evening | ORAL | 1 refills | Status: DC | PRN
  Filled 2023-03-19 (×2): qty 30, 30d supply, fill #0

## 2023-03-19 MED ORDER — METOPROLOL TARTRATE 25 MG PO TABS
25.0000 mg | ORAL_TABLET | Freq: Two times a day (BID) | ORAL | 0 refills | Status: DC
  Filled 2023-03-19 (×2): qty 60, 30d supply, fill #0

## 2023-03-19 MED ORDER — PANTOPRAZOLE SODIUM 40 MG PO TBEC
40.0000 mg | DELAYED_RELEASE_TABLET | Freq: Two times a day (BID) | ORAL | 1 refills | Status: DC
  Filled 2023-03-19 (×2): qty 30, 15d supply, fill #0

## 2023-03-19 MED ORDER — FLUOXETINE HCL 20 MG PO TABS
20.0000 mg | ORAL_TABLET | Freq: Every day | ORAL | 1 refills | Status: DC
Start: 2023-03-19 — End: 2023-04-03
  Filled 2023-03-19 (×2): qty 30, 30d supply, fill #0

## 2023-03-19 NOTE — Patient Instructions (Signed)
You are going to start taking Prozac on a daily basis.  If you feel this medication is making you sleepy during the day, please take it at bedtime.  To help with sleep, you are going to take trazodone 50 mg at bedtime.  Continue taking the vitamin D once weekly.  You are going to take pantoprazole 40 mg once daily to help with your stomach.  Please return to the mobile unit in 2 to 3 weeks.  Roney Jaffe, PA-C Physician Assistant [facility] [email or web address]

## 2023-03-19 NOTE — Progress Notes (Signed)
Established Patient Office Visit  Subjective   Patient ID: Peter Snow, male    DOB: 03-14-1996  Age: 27 y.o. MRN: [id number]  Chief Complaint  Patient presents with   Mouth dryness   Abdominal Pain    Describes feeling like something is shaking inside his body, it happens to different parts of body such as neck, behind knee, and both arms      Chest Pain    Left side chest pain, and pain in both arms    restless    States that he feels he has been having elevated anxiety.  States that he feels it is starting to affect his appetite as well.  States that he stopped taking the trazodone, Zoloft, and hydroxyzine.  States that he felt his anxiety was much better under control.  States that he had been going to a primary care provider in would [facility].  States that they prescribed metoprolol to help with his tachycardia.  States that he takes it at bedtime, but will occasionally take half tablet if he feels his heart is racing.  Does endorse that this has been occurring more frequently.  States that he does have difficulty falling asleep and staying asleep, states that he does wake up.  States that he has racing thoughts  States that he was also recently restarted on weekly vitamin D, states that he has taken this for 2 weeks.        03/19/2023   12:17 PM 02/22/2022    8:57 AM 02/13/2022    1:42 PM 01/02/2022    3:16 PM 10/17/2021    4:23 PM  Depression screen PHQ 2/9  Decreased Interest 3 0 0 0 0  Down, Depressed, Hopeless 3 0 0 0 0  PHQ - 2 Score 6 0 0 0 0  Altered sleeping 3  0  0  Tired, decreased energy   0  0  Change in appetite 0  0  0  Feeling bad or failure about yourself  0  0  0  Trouble concentrating 0  0  0  Moving slowly or fidgety/restless 0  0  0  Suicidal thoughts 0  0  0  PHQ-9 Score 9  0  0  Difficult doing work/chores Somewhat difficult          03/19/2023   12:17 PM 10/17/2021    4:23 PM  GAD 7 : Generalized Anxiety Score  Nervous,  Anxious, on Edge 2 0  Control/stop worrying 1 0  Worry too much - different things 1 1  Trouble relaxing 3 0  Restless 0 0  Easily annoyed or irritable 0 0  Afraid - awful might happen 1 0  Total GAD 7 Score 8 1      Past Medical History:  Diagnosis Date   GERD (gastroesophageal reflux disease)    H. pylori infection    Sinus tachycardia    Social History   Socioeconomic History   Marital status: Single    Spouse name: Not on file   Number of children: Not on file   Years of education: Not on file   Highest education level: Not on file  Occupational History   Occupation: unemployed  Tobacco Use   Smoking status: Never    Passive exposure: Never   Smokeless tobacco: Never  Vaping Use   Vaping Use: Never used  Substance and Sexual Activity   Alcohol use: Never   Drug use: Never   Sexual activity: Not  Currently  Other Topics Concern   Not on file  Social History Narrative   Not on file   Social Determinants of Health   Financial Resource Strain: Not on file  Food Insecurity: Not on file  Transportation Needs: Not on file  Physical Activity: Not on file  Stress: Not on file  Social Connections: Not on file  Intimate Partner Violence: Not on file   History reviewed. No pertinent family history. No Known Allergies  Review of Systems  Constitutional: Negative.   HENT: Negative.    Eyes: Negative.   Respiratory:  Negative for shortness of breath.   Cardiovascular:  Negative for chest pain.  Gastrointestinal:  Negative for heartburn, nausea and vomiting.  Genitourinary: Negative.   Musculoskeletal: Negative.   Skin: Negative.   Neurological: Negative.   Endo/Heme/Allergies: Negative.   Psychiatric/Behavioral:  Positive for depression. Negative for suicidal ideas. The patient is nervous/anxious and has insomnia.       Objective:     BP 124/69 (BP Location: Left Arm, Patient Position: Sitting, Cuff Size: Normal)   Pulse 100   Ht 5\' 7"  (1.702 m)   Wt  126 lb (57.2 kg)   SpO2 100%   BMI 19.73 kg/m    Physical Exam Vitals and nursing note reviewed.  Constitutional:      Appearance: Normal appearance.  HENT:     Head: Normocephalic and atraumatic.     Right Ear: External ear normal.     Left Ear: External ear normal.     Nose: Nose normal.     Mouth/Throat:     Mouth: Mucous membranes are moist.     Pharynx: Oropharynx is clear.  Eyes:     Extraocular Movements: Extraocular movements intact.     Conjunctiva/sclera: Conjunctivae normal.     Pupils: Pupils are equal, round, and reactive to light.  Cardiovascular:     Rate and Rhythm: Normal rate and regular rhythm.     Pulses: Normal pulses.     Heart sounds: Normal heart sounds.  Pulmonary:     Effort: Pulmonary effort is normal.     Breath sounds: Normal breath sounds.  Musculoskeletal:        General: Normal range of motion.     Cervical back: Normal range of motion.  Skin:    General: Skin is warm and dry.  Neurological:     General: No focal deficit present.     Mental Status: He is alert and oriented to person, place, and time.  Psychiatric:        Mood and Affect: Mood normal.        Behavior: Behavior normal.        Thought Content: Thought content normal.        Judgment: Judgment normal.        Assessment & Plan:   Problem List Items Addressed This Visit       Digestive   GERD (gastroesophageal reflux disease)   Relevant Medications   pantoprazole (PROTONIX) 40 MG tablet     Other   Generalized anxiety disorder - Primary   Relevant Medications   FLUoxetine (PROZAC) 20 MG tablet   traZODone (DESYREL) 50 MG tablet   Other Relevant Orders   Ambulatory referral to Psychiatry   Psychophysiological insomnia   Relevant Medications   traZODone (DESYREL) 50 MG tablet   Other Relevant Orders   Ambulatory referral to Psychiatry   Other Visit Diagnoses     Tachycardia  Relevant Medications   metoprolol tartrate (LOPRESSOR) 25 MG tablet       1. Generalized anxiety disorder Patient agreeable to trial Prozac.  Patient agreeable to referral for CBT.  Patient education given on coping skills.  Red flags given for prompt reevaluation.  Patient encouraged to return to mobile unit in 2 to 3 weeks.  Patient understands and agrees - FLUoxetine (PROZAC) 20 MG tablet; Take 1 tablet (20 mg total) by mouth daily.  Dispense: 30 tablet; Refill: 1 - Ambulatory referral to Psychiatry  2. Gastroesophageal reflux disease without esophagitis Continue current regimen - pantoprazole (PROTONIX) 40 MG tablet; Take 1 tablet by mouth 2 times daily for 14 days.  Dispense: 30 tablet; Refill: 1  3. Tachycardia Continue current regimen - metoprolol tartrate (LOPRESSOR) 25 MG tablet; Take 1 tablet (25 mg total) by mouth 2 (two) times daily.  Dispense: 60 tablet; Refill: 0  4. Psychophysiological insomnia Patient agreeable to another trial of trazodone. - traZODone (DESYREL) 50 MG tablet; Take 1 tablet (50 mg total) by mouth at bedtime as needed for sleep.  Dispense: 30 tablet; Refill: 1 - Ambulatory referral to Psychiatry   I have reviewed the patient's medical history (PMH, PSH, Social History, Family History, Medications, and allergies) , and have been updated if relevant. I spent 30 minutes reviewing chart and  face to face time with patient.    Return in about 3 weeks (around 04/09/2023) for with [facility].    Kasandra Knudsen Mayers, PA-C

## 2023-04-03 ENCOUNTER — Other Ambulatory Visit: Payer: Self-pay

## 2023-04-03 ENCOUNTER — Ambulatory Visit: Payer: Medicaid Other | Admitting: Physician Assistant

## 2023-04-03 ENCOUNTER — Encounter: Payer: Self-pay | Admitting: Physician Assistant

## 2023-04-03 VITALS — BP 119/75 | HR 62 | Ht 67.0 in | Wt 128.0 lb

## 2023-04-03 DIAGNOSIS — R000 Tachycardia, unspecified: Secondary | ICD-10-CM | POA: Diagnosis not present

## 2023-04-03 DIAGNOSIS — F411 Generalized anxiety disorder: Secondary | ICD-10-CM | POA: Diagnosis not present

## 2023-04-03 DIAGNOSIS — K219 Gastro-esophageal reflux disease without esophagitis: Secondary | ICD-10-CM

## 2023-04-03 DIAGNOSIS — Z8619 Personal history of other infectious and parasitic diseases: Secondary | ICD-10-CM

## 2023-04-03 MED ORDER — METOPROLOL TARTRATE 25 MG PO TABS
25.0000 mg | ORAL_TABLET | Freq: Two times a day (BID) | ORAL | 1 refills | Status: DC
  Filled 2023-04-03 – 2023-04-20 (×2): qty 60, 30d supply, fill #0

## 2023-04-03 MED ORDER — FLUOXETINE HCL 20 MG PO TABS
20.0000 mg | ORAL_TABLET | Freq: Every day | ORAL | 0 refills | Status: DC
  Filled 2023-04-03 – 2023-04-20 (×2): qty 90, 90d supply, fill #0

## 2023-04-03 MED ORDER — PANTOPRAZOLE SODIUM 40 MG PO TBEC
40.0000 mg | DELAYED_RELEASE_TABLET | Freq: Two times a day (BID) | ORAL | 1 refills | Status: DC
  Filled 2023-04-03: qty 30, 15d supply, fill #0

## 2023-04-03 NOTE — Patient Instructions (Signed)
I started a referral for you to be seen by gastroenterology, they will call you to set up appointment.   Please return to the mobile unit in 3 months, or sooner if needed.  Roney Jaffe, PA-C Physician Assistant [facility] [email or web address]   Insomnia Insomnia is a sleep disorder that makes it difficult to fall asleep or stay asleep. Insomnia can cause fatigue, low energy, difficulty concentrating, mood swings, and poor performance at work or school. There are three different ways to classify insomnia: Difficulty falling asleep. Difficulty staying asleep. Waking up too early in the morning. Any type of insomnia can be long-term (chronic) or short-term (acute). Both are common. Short-term insomnia usually lasts for 3 months or less. Chronic insomnia occurs at least three times a week for longer than 3 months. What are the causes? Insomnia may be caused by another condition, situation, or substance, such as: Having certain mental health conditions, such as anxiety and depression. Using caffeine, alcohol, tobacco, or drugs. Having gastrointestinal conditions, such as gastroesophageal reflux disease (GERD). Having certain medical conditions. These include: Asthma. Alzheimer's disease. Stroke. Chronic pain. An overactive thyroid gland (hyperthyroidism). Other sleep disorders, such as restless legs syndrome and sleep apnea. Menopause. Sometimes, the cause of insomnia may not be known. What increases the risk? Risk factors for insomnia include: Gender. Females are affected more often than males. Age. Insomnia is more common as people get older. Stress and certain medical and mental health conditions. Lack of exercise. Having an irregular work schedule. This may include working night shifts and traveling between different time zones. What are the signs or symptoms? If you have insomnia, the main symptom is having trouble  falling asleep or having trouble staying asleep. This may lead to other symptoms, such as: Feeling tired or having low energy. Feeling nervous about going to sleep. Not feeling rested in the morning. Having trouble concentrating. Feeling irritable, anxious, or depressed. How is this diagnosed? This condition may be diagnosed based on: Your symptoms and medical history. Your health care provider may ask about: Your sleep habits. Any medical conditions you have. Your mental health. A physical exam. How is this treated? Treatment for insomnia depends on the cause. Treatment may focus on treating an underlying condition that is causing the insomnia. Treatment may also include: Medicines to help you sleep. Counseling or therapy. Lifestyle adjustments to help you sleep better. Follow these instructions at home: Eating and drinking  Limit or avoid alcohol, caffeinated beverages, and products that contain nicotine and tobacco, especially close to bedtime. These can disrupt your sleep. Do not eat a large meal or eat spicy foods right before bedtime. This can lead to digestive discomfort that can make it hard for you to sleep. Sleep habits  Keep a sleep diary to help you and your health care provider figure out what could be causing your insomnia. Write down: When you sleep. When you wake up during the night. How well you sleep and how rested you feel the next day. Any side effects of medicines you are taking. What you eat and drink. Make your bedroom a dark, comfortable place where it is easy to fall asleep. Put up shades or blackout curtains to block light from outside. Use a white noise machine to block noise. Keep the temperature cool. Limit screen use before bedtime. This includes: Not watching TV. Not using your smartphone, tablet, or computer. Stick to a routine that includes going to bed and waking up at the same times every  day and night. This can help you fall asleep faster.  Consider making a quiet activity, such as reading, part of your nighttime routine. Try to avoid taking naps during the day so that you sleep better at night. Get out of bed if you are still awake after 15 minutes of trying to sleep. Keep the lights down, but try reading or doing a quiet activity. When you feel sleepy, go back to bed. General instructions Take over-the-counter and prescription medicines only as told by your health care provider. Exercise regularly as told by your health care provider. However, avoid exercising in the hours right before bedtime. Use relaxation techniques to manage stress. Ask your health care provider to suggest some techniques that may work well for you. These may include: Breathing exercises. Routines to release muscle tension. Visualizing peaceful scenes. Make sure that you drive carefully. Do not drive if you feel very sleepy. Keep all follow-up visits. This is important. Contact a health care provider if: You are tired throughout the day. You have trouble in your daily routine due to sleepiness. You continue to have sleep problems, or your sleep problems get worse. Get help right away if: You have thoughts about hurting yourself or someone else. Get help right away if you feel like you may hurt yourself or others, or have thoughts about taking your own life. Go to your nearest emergency room or: Call 911. Call the National Suicide Prevention Lifeline at (332) 458-4195 or 988. This is open 24 hours a day. Text the Crisis Text Line at 904-883-2091. Summary Insomnia is a sleep disorder that makes it difficult to fall asleep or stay asleep. Insomnia can be long-term (chronic) or short-term (acute). Treatment for insomnia depends on the cause. Treatment may focus on treating an underlying condition that is causing the insomnia. Keep a sleep diary to help you and your health care provider figure out what could be causing your insomnia. This information is not  intended to replace advice given to you by your health care provider. Make sure you discuss any questions you have with your health care provider. Document Revised: 10/16/2021 Document Reviewed: 10/16/2021 Elsevier Patient Education  2023 [employer].

## 2023-04-03 NOTE — Progress Notes (Signed)
Established Patient Office Visit  Subjective   Patient ID: Peter Snow, male    DOB: 01/11/1996  Age: 27 y.o. MRN: [id number]  Chief Complaint  Patient presents with   Tingling    In legs and body.    Gastroesophageal Reflux    Painful when eating and some pain without    States that he did start the Prozac and feels that it is starting to offer relief.  States that he feels "less scared."  States that he is sleeping better, states that he is only use the trazodone 1 time, states that he did feel it gave him a headache.  States that he does continue to have abdominal pain, states that he is taking the pantoprazole twice daily and following a GERD friendly diet.  States that he was seen by cardiology earlier this morning due to going to the emergency room for having chest wall pain last week.  States that he was told that his EKG was within normal limits.  States that he is scheduled for an echocardiogram in the future.     Past Medical History:  Diagnosis Date   GERD (gastroesophageal reflux disease)    H. pylori infection    Sinus tachycardia    Social History   Socioeconomic History   Marital status: Single    Spouse name: Not on file   Number of children: Not on file   Years of education: Not on file   Highest education level: Not on file  Occupational History   Occupation: unemployed  Tobacco Use   Smoking status: Never    Passive exposure: Never   Smokeless tobacco: Never  Vaping Use   Vaping Use: Never used  Substance and Sexual Activity   Alcohol use: Never   Drug use: Never   Sexual activity: Not Currently  Other Topics Concern   Not on file  Social History Narrative   Not on file   Social Determinants of Health   Financial Resource Strain: Not on file  Food Insecurity: Not on file  Transportation Needs: Not on file  Physical Activity: Not on file  Stress: Not on file  Social Connections: Not on file  Intimate Partner Violence: Not on file    No family history on file. No Known Allergies  Review of Systems  Constitutional: Negative.   HENT: Negative.    Eyes: Negative.   Respiratory:  Negative for shortness of breath.   Cardiovascular:  Negative for chest pain.  Gastrointestinal:  Positive for abdominal pain. Negative for nausea and vomiting.  Genitourinary: Negative.   Musculoskeletal: Negative.   Skin: Negative.   Neurological: Negative.   Endo/Heme/Allergies: Negative.   Psychiatric/Behavioral:  Negative for depression. The patient is not nervous/anxious and does not have insomnia.       Objective:     BP 119/75 (BP Location: Left Arm, Patient Position: Sitting, Cuff Size: Normal)   Pulse 62   Ht 5\' 7"  (1.702 m)   Wt 128 lb (58.1 kg)   SpO2 100%   BMI 20.05 kg/m    Physical Exam Vitals and nursing note reviewed.  Constitutional:      Appearance: Normal appearance.  HENT:     Head: Normocephalic and atraumatic.     Right Ear: External ear normal.     Left Ear: External ear normal.     Nose: Nose normal.     Mouth/Throat:     Mouth: Mucous membranes are moist.     Pharynx: Oropharynx is  clear.  Eyes:     Extraocular Movements: Extraocular movements intact.     Conjunctiva/sclera: Conjunctivae normal.     Pupils: Pupils are equal, round, and reactive to light.  Cardiovascular:     Rate and Rhythm: Normal rate and regular rhythm.     Pulses: Normal pulses.     Heart sounds: Normal heart sounds.  Pulmonary:     Effort: Pulmonary effort is normal.     Breath sounds: Normal breath sounds.  Musculoskeletal:        General: Normal range of motion.     Cervical back: Normal range of motion and neck supple.  Skin:    General: Skin is warm and dry.  Neurological:     General: No focal deficit present.     Mental Status: He is alert and oriented to person, place, and time.  Psychiatric:        Mood and Affect: Mood normal.        Behavior: Behavior normal.        Thought Content: Thought content  normal.        Judgment: Judgment normal.         Assessment & Plan:   Problem List Items Addressed This Visit       Digestive   GERD (gastroesophageal reflux disease)   Relevant Medications   pantoprazole (PROTONIX) 40 MG tablet   Other Relevant Orders   Ambulatory referral to Gastroenterology     Other   History of Helicobacter pylori infection - Primary   Relevant Orders   Ambulatory referral to Gastroenterology   Generalized anxiety disorder   Relevant Medications   FLUoxetine (PROZAC) 20 MG tablet   Other Visit Diagnoses     Tachycardia       Relevant Medications   metoprolol tartrate (LOPRESSOR) 25 MG tablet      1. Generalized anxiety disorder Continue current regimen.  Patient encouraged to return to mobile unit in 3 months.  Patient would prefer to have mobile unit as primary care provider.  Patient education given on supportive care. - FLUoxetine (PROZAC) 20 MG tablet; Take 1 tablet (20 mg total) by mouth daily.  Dispense: 90 tablet; Refill: 0  2. Gastroesophageal reflux disease without esophagitis Continue current regimen, refer to gastroenterology for further evaluation. - pantoprazole (PROTONIX) 40 MG tablet; Take 1 tablet by mouth 2 times daily for 14 days.  Dispense: 30 tablet; Refill: 1 - Ambulatory referral to Gastroenterology  3. Tachycardia Continue current regimen - metoprolol tartrate (LOPRESSOR) 25 MG tablet; Take 1 tablet (25 mg total) by mouth 2 (two) times daily.  Dispense: 60 tablet; Refill: 1  4. History of Helicobacter pylori infection  - Ambulatory referral to Gastroenterology   I have reviewed the patient's medical history (PMH, PSH, Social History, Family History, Medications, and allergies) , and have been updated if relevant. I spent 30 minutes reviewing chart and  face to face time with patient.    Return in about 3 months (around 07/04/2023).    Kasandra Knudsen Mayers, PA-C

## 2023-04-20 ENCOUNTER — Other Ambulatory Visit (HOSPITAL_COMMUNITY): Payer: Self-pay

## 2023-06-07 ENCOUNTER — Encounter: Payer: Self-pay | Admitting: Physician Assistant

## 2023-07-01 ENCOUNTER — Ambulatory Visit: Payer: PRIVATE HEALTH INSURANCE | Admitting: Physician Assistant

## 2023-07-01 ENCOUNTER — Encounter: Payer: Self-pay | Admitting: Physician Assistant

## 2023-07-01 VITALS — BP 114/78 | HR 84 | Ht 67.0 in | Wt 130.0 lb

## 2023-07-01 DIAGNOSIS — K219 Gastro-esophageal reflux disease without esophagitis: Secondary | ICD-10-CM | POA: Diagnosis not present

## 2023-07-01 DIAGNOSIS — E559 Vitamin D deficiency, unspecified: Secondary | ICD-10-CM | POA: Diagnosis not present

## 2023-07-01 DIAGNOSIS — F411 Generalized anxiety disorder: Secondary | ICD-10-CM

## 2023-07-01 MED ORDER — FLUOXETINE HCL 20 MG PO TABS: 20.0000 mg | ORAL_TABLET | Freq: Every day | ORAL | 0 refills | Status: DC

## 2023-07-01 MED ORDER — PANTOPRAZOLE SODIUM 40 MG PO TBEC: 40.0000 mg | DELAYED_RELEASE_TABLET | Freq: Two times a day (BID) | ORAL | 1 refills | Status: AC

## 2023-07-01 NOTE — Patient Instructions (Addendum)
You are going to take the pantoprazole twice daily.  I encourage you to focus on not sleeping past 7 to 8 hours, doing some form of exercise during the day.  Roney Jaffe, PA-C Physician Assistant [facility] [email or web address]   Fatigue If you have fatigue, you feel tired all the time and have a lack of energy or a lack of motivation. Fatigue may make it difficult to start or complete tasks because of exhaustion. Occasional or mild fatigue is often a normal response to activity or life. However, long-term (chronic) or extreme fatigue may be a symptom of a medical condition such as: Depression. Not having enough red blood cells or hemoglobin in the blood (anemia). A problem with a small gland located in the lower front part of the neck (thyroid disorder). Rheumatologic conditions. These are problems related to the body's defense system (immune system). Infections, especially certain viral infections. Fatigue can also lead to negative health outcomes over time. Follow these instructions at home: Medicines Take over-the-counter and prescription medicines only as told by your health care provider. Take a multivitamin if told by your health care provider. Do not use herbal or dietary supplements unless they are approved by your health care provider. Eating and drinking  Avoid heavy meals in the evening. Eat a well-balanced diet, which includes lean proteins, whole grains, plenty of fruits and vegetables, and low-fat dairy products. Avoid eating or drinking too many products with caffeine in them. Avoid alcohol. Drink enough fluid to keep your urine pale yellow. Activity  Exercise regularly, as told by your health care provider. Use or practice techniques to help you relax, such as yoga, tai chi, meditation, or massage therapy. Lifestyle Change situations that cause you stress. Try to keep your work and personal schedules in  balance. Do not use recreational or illegal drugs. General instructions Monitor your fatigue for any changes. Go to bed and get up at the same time every day. Avoid fatigue by pacing yourself during the day and getting enough sleep at night. Maintain a healthy weight. Contact a health care provider if: Your fatigue does not get better. You have a fever. You suddenly lose or gain weight. You have headaches. You have trouble falling asleep or sleeping through the night. You feel angry, guilty, anxious, or sad. You have swelling in your legs or another part of your body. Get help right away if: You feel confused, feel like you might faint, or faint. Your vision is blurry or you have a severe headache. You have severe pain in your abdomen, your back, or the area between your waist and hips (pelvis). You have chest pain, shortness of breath, or an irregular or fast heartbeat. You are unable to urinate, or you urinate less than normal. You have abnormal bleeding from the rectum, nose, lungs, nipples, or, if you are male, the vagina. You vomit blood. You have thoughts about hurting yourself or others. These symptoms may be an emergency. Get help right away. Call 911. Do not wait to see if the symptoms will go away. Do not drive yourself to the hospital. Get help right away if you feel like you may hurt yourself or others, or have thoughts about taking your own life. Go to your nearest emergency room or: Call 911. Call the National Suicide Prevention Lifeline at 534-498-1568 or 988. This is open 24 hours a day. Text the Crisis Text Line at 662 603 5995. Summary If you have fatigue, you feel tired all the time and  have a lack of energy or a lack of motivation. Fatigue may make it difficult to start or complete tasks because of exhaustion. Long-term (chronic) or extreme fatigue may be a symptom of a medical condition. Exercise regularly, as told by your health care provider. Change situations  that cause you stress. Try to keep your work and personal schedules in balance. This information is not intended to replace advice given to you by your health care provider. Make sure you discuss any questions you have with your health care provider. Document Revised: 08/28/2021 Document Reviewed: 08/28/2021 Elsevier Patient Education  2024 [employer].

## 2023-07-01 NOTE — Progress Notes (Unsigned)
   Established Patient Office Visit  Subjective   Patient ID: Peter Snow, male    DOB: 05-Mar-1996  Age: 27 y.o. MRN: [id number]  Chief Complaint  Patient presents with   Fatigue   Nausea   GI Problem    Pain, described as a burning feeling on right side,    Pressure Behind the Eyes    Also nose and back of head.     Will be seeing gi 10/4  Right side - burngin 10 minutes a day no rhyme or reason Goes away on its way   Is only taking protonix as needed  Prozac - anxiety is still good  Sleep - feel sleepy all the time  Lost focus Two weeks  9- 10 hours  of sleep Stress is fine   Exercise -  Vit d  - once weekly  Thyroid  - march 24   Water - 4 bottles     {History (Optional):23778}  ROS    Objective:     Ht 5\' 7"  (1.702 m)   Wt 130 lb (59 kg)   BMI 20.36 kg/m  {Vitals History (Optional):23777}  Physical Exam   No results found for any visits on 07/01/23.  {Labs (Optional):23779}  The ASCVD Risk score (Arnett DK, et al., 2019) failed to calculate for the following reasons:   The 2019 ASCVD risk score is only valid for ages 90 to 41    Assessment & Plan:   Problem List Items Addressed This Visit   None   No follow-ups on file.    Kasandra Knudsen Mayers, PA-C

## 2023-07-02 ENCOUNTER — Encounter: Payer: Self-pay | Admitting: Physician Assistant

## 2023-07-02 DIAGNOSIS — E559 Vitamin D deficiency, unspecified: Secondary | ICD-10-CM | POA: Insufficient documentation

## 2023-07-10 ENCOUNTER — Ambulatory Visit: Payer: Medicaid Other | Admitting: Internal Medicine

## 2023-08-02 ENCOUNTER — Other Ambulatory Visit: Payer: Self-pay | Admitting: Physician Assistant

## 2023-08-02 DIAGNOSIS — K219 Gastro-esophageal reflux disease without esophagitis: Secondary | ICD-10-CM

## 2023-08-23 ENCOUNTER — Encounter: Payer: Self-pay | Admitting: Physician Assistant

## 2023-08-23 ENCOUNTER — Ambulatory Visit (INDEPENDENT_AMBULATORY_CARE_PROVIDER_SITE_OTHER): Payer: PRIVATE HEALTH INSURANCE | Admitting: Physician Assistant

## 2023-08-23 VITALS — BP 90/70 | HR 78 | Ht 67.75 in | Wt 131.4 lb

## 2023-08-23 DIAGNOSIS — Z8619 Personal history of other infectious and parasitic diseases: Secondary | ICD-10-CM | POA: Diagnosis not present

## 2023-08-23 DIAGNOSIS — R110 Nausea: Secondary | ICD-10-CM

## 2023-08-23 DIAGNOSIS — R1013 Epigastric pain: Secondary | ICD-10-CM

## 2023-08-23 DIAGNOSIS — G8929 Other chronic pain: Secondary | ICD-10-CM | POA: Diagnosis not present

## 2023-08-23 NOTE — Progress Notes (Signed)
Chief Complaint: GERD, history of H. pylori  HPI:    [name] is a 27 year old Pashto male, with a past medical history as listed below including tachycardia (05/06/2023 echo with LVEF 55-60% and otherwise normal) reflux and H. pylori who was referred to me by Peter Jaffe, PA-C for a complaint of reflux.     01/16/2022 patient had H. pylori breath test positive and again 02/14/2022.    03/24/2023 BMP with a was elevated at 105 and otherwise normal, CBC normal, CMP with a minimally elevated bilirubin at 1.4.     04/03/2023 patient followed with cardiology and at that time thought to have noncardiac chest pain.    07/01/2023 patient saw PCP and describes some intermittent episodes of right upper quadrant "burning" over the past month.  At that time only taking Pantoprazole as needed.  1-2 times a week.  Recommended he take his Pantoprazole 40 twice daily for a couple of weeks to see if this helped and was referred to [place].    08/10/23 patient seen in the urgent care for "a fast heart", had been evaluated in the past in April and was given Metoprolol.  At that time diagnosed with anxiety.    Today, the patient presents to clinic accompanied by an interpreter, though he does not use him very often, and tells me that he feels a "pulsing" in his stomach which even occurs sometimes when he is sleeping and very occasional pain rated as a 3/7.  This has been going on for the past year or so, no real change after taking meds for H. pylori about a year ago.  Tells me he was told this infection was gone after his second round of antibiotics.  Tried a PPI and is still taking Pantoprazole 40 mg which used to uses this as needed a couple times a week.  Describes taking it daily for about 2 weeks but did not feel like it made much difference.  Associated symptoms include occasional nausea.  Also describes occasional constipation.    Denies fever, chills, weight loss or vomiting.  Past Medical History:  Diagnosis  Date   GERD (gastroesophageal reflux disease)    H. pylori infection    Sinus tachycardia     No past surgical history on file.  Current Outpatient Medications  Medication Sig Dispense Refill   ergocalciferol (DRISDOL) 1.25 MG (50000 UT) capsule Take 1 capsule (50,000 Units total) by mouth once a week. 12 capsule 0   FLUoxetine (PROZAC) 20 MG tablet Take 1 tablet (20 mg total) by mouth daily. 90 tablet 0   ondansetron (ZOFRAN-ODT) 8 MG disintegrating tablet Take 1 tablet (8 mg total) by mouth every 8 (eight) hours as needed for nausea or vomiting. 20 tablet 0   pantoprazole (PROTONIX) 40 MG tablet Take 1 tablet (40 mg total) by mouth 2 (two) times daily. 60 tablet 1   No current facility-administered medications for this visit.    Allergies as of 08/23/2023   (No Known Allergies)    No family history on file.  Social History   Socioeconomic History   Marital status: Single    Spouse name: Not on file   Number of children: Not on file   Years of education: Not on file   Highest education level: Not on file  Occupational History   Occupation: unemployed  Tobacco Use   Smoking status: Never    Passive exposure: Never   Smokeless tobacco: Never  Vaping Use   Vaping  status: Never Used  Substance and Sexual Activity   Alcohol use: Never   Drug use: Never   Sexual activity: Not Currently  Other Topics Concern   Not on file  Social History Narrative   Not on file   Social Determinants of Health   Financial Resource Strain: Low Risk  (04/03/2023)   Received from [facility], [facility]   Overall Financial Resource Strain (CARDIA)    Difficulty of Paying Living Expenses: Not hard at all  Food Insecurity: No Food Insecurity (04/03/2023)   Received from [facility], [facility]   Hunger Vital Sign    Worried About Running Out of Food in the Last Year: Never true    Ran Out of Food in the Last Year: Never true  Transportation Needs: No Transportation Needs  (04/03/2023)   Received from [facility], [facility]   PRAPARE - Transportation    Lack of Transportation (Medical): No    Lack of Transportation (Non-Medical): No  Physical Activity: Not on file  Stress: Not on file  Social Connections: Unknown (04/02/2023)   Received from [facility], [facility]   Social Network    Social Network: Not on file  Intimate Partner Violence: Unknown (04/02/2023)   Received from [facility], [facility]   HITS    Physically Hurt: Not on file    Insult or Talk Down To: Not on file    Threaten Physical Harm: Not on file    Scream or Curse: Not on file    Review of Systems:    Constitutional: No weight loss, fever or chills Skin: No rash  Cardiovascular: No chest pain  Respiratory: No SOB  Gastrointestinal: See HPI and otherwise negative Genitourinary: No dysuria  Neurological: No headache, dizziness or syncope Musculoskeletal: No new muscle or joint pain Hematologic: No bleeding  Psychiatric: +anxiety   Physical Exam:  Vital signs: BP 90/70 (BP Location: Left Arm, Patient Position: Sitting, Cuff Size: Normal)   Pulse 78   Ht 5' 7.75" (1.721 m)   Wt 131 lb 6 oz (59.6 kg)   BMI 20.12 kg/m    Constitutional:   Pleasant male appears to be in NAD, Well developed, Well nourished, alert and cooperative Head:  Normocephalic and atraumatic. Eyes:   PEERL, EOMI. No icterus. Conjunctiva pink. Ears:  Normal auditory acuity. Neck:  Supple Throat: Oral cavity and pharynx without inflammation, swelling or lesion.  Respiratory: Respirations even and unlabored. Lungs clear to auscultation bilaterally.   No wheezes, crackles, or rhonchi.  Cardiovascular: Normal S1, S2. No MRG. Regular rate and rhythm. No peripheral edema, cyanosis or pallor.  Gastrointestinal:  Soft, nondistended, nontender. No rebound or guarding. Normal bowel sounds. No appreciable masses or hepatomegaly. Rectal:  Not performed.  Msk:  Symmetrical without gross deformities.  Without edema, no deformity or joint abnormality.  Neurologic:  Alert and  oriented x4;  grossly normal neurologically.  Skin:   Dry and intact without significant lesions or rashes. Psychiatric: Demonstrates good judgement and reason without abnormal affect or behaviors.  RELEVANT LABS AND IMAGING: CBC    Component Value Date/Time   WBC 6.2 03/15/2022 2230   RBC 5.48 03/15/2022 2230   HGB 15.2 03/15/2022 2230   HGB 16.2 11/15/2020 0950   HCT 45.4 03/15/2022 2230   HCT 47.6 11/15/2020 0950   PLT 327 03/15/2022 2230   PLT 304 11/15/2020 0950   MCV 82.8 03/15/2022 2230   MCV 85 11/15/2020 0950   MCH 27.7 03/15/2022 2230  MCHC 33.5 03/15/2022 2230   RDW 12.6 03/15/2022 2230   RDW 12.3 11/15/2020 0950   LYMPHSABS 3.8 11/30/2021 0252   LYMPHSABS 3.6 (H) 11/15/2020 0950   MONOABS 0.8 11/30/2021 0252   EOSABS 0.3 11/30/2021 0252   EOSABS 0.3 11/15/2020 0950   BASOSABS 0.1 11/30/2021 0252   BASOSABS 0.0 11/15/2020 0950    CMP     Component Value Date/Time   NA 137 03/15/2022 2230   NA 140 11/15/2020 0950   K 3.9 03/15/2022 2230   CL 97 (L) 03/15/2022 2230   CO2 30 03/15/2022 2230   GLUCOSE 109 (H) 03/15/2022 2230   BUN 9 03/15/2022 2230   BUN 15 11/15/2020 0950   CREATININE 0.63 03/15/2022 2230   CALCIUM 10.3 03/15/2022 2230   PROT 8.3 (H) 03/15/2022 2230   PROT 7.3 11/15/2020 0950   ALBUMIN 5.1 (H) 03/15/2022 2230   ALBUMIN 4.9 11/15/2020 0950   AST 20 03/15/2022 2230   ALT 20 03/15/2022 2230   ALKPHOS 82 03/15/2022 2230   BILITOT 0.8 03/15/2022 2230   BILITOT 0.5 11/15/2020 0950   GFRNONAA >60 03/15/2022 2230   GFRAA 146 11/15/2020 0950    Assessment: 1.  Epigastric pain and nausea: For the past year or so off-and-on, tried Pantoprazole 40 mg daily for 2 weeks but it did not really help, continues to use it as needed for increased symptoms; consider gastritis +/- H. pylori 2.  History of H. pylori: Diagnosed initially in 2023, treated x 2 and the patient tells  me it was eradicated, the last breath test I can see it was still positive, could be contributing to above  Plan: 1.  Scheduled patient for diagnostic EGD in the [facility] with Dr. [name].  Did provide the patient a detailed list of risk for the procedure and he agrees to proceed. Patient is appropriate for endoscopic procedure(s) in the ambulatory ([facility]) setting.  2.  Continue Pantoprazole 40 mg as needed for now.  Discussed that we may need to have him take this more frequently pending results from above. 3.  Patient to follow in clinic per recommendations after time of EGD.  Hyacinth Meeker, PA-C [facility] 08/23/2023, 11:37 AM  Cc: Peter Jaffe, PA-C

## 2023-08-23 NOTE — Progress Notes (Signed)
I agree with the assessment and plan as outlined by Peter Snow. 

## 2023-08-23 NOTE — Patient Instructions (Signed)
You have been scheduled for an endoscopy. Please follow written instructions given to you at your visit today.  If you use inhalers (even only as needed), please bring them with you on the day of your procedure.  If you take any of the following medications, they will need to be adjusted prior to your procedure:   DO NOT TAKE 7 DAYS PRIOR TO TEST- Trulicity (dulaglutide) Ozempic, Wegovy (semaglutide) Mounjaro (tirzepatide) Bydureon Bcise (exanatide extended release)  DO NOT TAKE 1 DAY PRIOR TO YOUR TEST Rybelsus (semaglutide) Adlyxin (lixisenatide) Victoza (liraglutide) Byetta (exanatide) ___________________________________________________________________________    Due to recent changes in healthcare laws, you may see the results of your imaging and laboratory studies on MyChart before your provider has had a chance to review them.  We understand that in some cases there may be results that are confusing or concerning to you. Not all laboratory results come back in the same time frame and the provider may be waiting for multiple results in order to interpret others.  Please give [place] 48 hours in order for your provider to thoroughly review all the results before contacting the office for clarification of your results.   Thank you for choosing me and [facility].  Hyacinth Meeker, PA-C

## 2023-09-04 ENCOUNTER — Encounter: Payer: Self-pay | Admitting: Internal Medicine

## 2023-09-04 ENCOUNTER — Ambulatory Visit: Payer: Medicaid Other | Admitting: Internal Medicine

## 2023-09-04 VITALS — BP 134/80 | HR 85 | Temp 99.1°F | Ht 67.0 in | Wt 131.0 lb

## 2023-09-04 DIAGNOSIS — G8929 Other chronic pain: Secondary | ICD-10-CM

## 2023-09-04 DIAGNOSIS — R110 Nausea: Secondary | ICD-10-CM

## 2023-09-04 MED ORDER — SODIUM CHLORIDE 0.9 % IV SOLN: 500.0000 mL | Freq: Once | INTRAVENOUS | Status: DC

## 2023-09-04 NOTE — Progress Notes (Signed)
Patient consumed a banana smoothie at 12 PM today and thus his EGD had to be rescheduled.

## 2023-09-04 NOTE — Progress Notes (Signed)
PT drank a banana Smoothie at 12 today. Procedure will be rescheduled per anesthesia.

## 2023-09-10 ENCOUNTER — Encounter: Payer: Self-pay | Admitting: Internal Medicine

## 2023-09-10 ENCOUNTER — Ambulatory Visit (AMBULATORY_SURGERY_CENTER): Payer: PRIVATE HEALTH INSURANCE | Admitting: Internal Medicine

## 2023-09-10 VITALS — BP 110/77 | HR 65 | Temp 97.7°F | Resp 17 | Ht 67.0 in | Wt 131.0 lb

## 2023-09-10 DIAGNOSIS — Z8619 Personal history of other infectious and parasitic diseases: Secondary | ICD-10-CM

## 2023-09-10 DIAGNOSIS — B9681 Helicobacter pylori [H. pylori] as the cause of diseases classified elsewhere: Secondary | ICD-10-CM | POA: Diagnosis not present

## 2023-09-10 DIAGNOSIS — G8929 Other chronic pain: Secondary | ICD-10-CM

## 2023-09-10 DIAGNOSIS — R110 Nausea: Secondary | ICD-10-CM

## 2023-09-10 DIAGNOSIS — K31A0 Gastric intestinal metaplasia, unspecified: Secondary | ICD-10-CM | POA: Diagnosis not present

## 2023-09-10 DIAGNOSIS — K2970 Gastritis, unspecified, without bleeding: Secondary | ICD-10-CM | POA: Diagnosis present

## 2023-09-10 DIAGNOSIS — K2950 Unspecified chronic gastritis without bleeding: Secondary | ICD-10-CM | POA: Diagnosis not present

## 2023-09-10 MED ORDER — SODIUM CHLORIDE 0.9 % IV SOLN: 500.0000 mL | Freq: Once | INTRAVENOUS | Status: DC

## 2023-09-10 NOTE — Progress Notes (Signed)
Sedate, gd SR, tolerated procedure well, VSS, report to RN 

## 2023-09-10 NOTE — Progress Notes (Signed)
Pt's states no medical or surgical changes since previsit or office visit. 

## 2023-09-10 NOTE — Progress Notes (Signed)
GASTROENTEROLOGY PROCEDURE H&P NOTE   Primary Care Physician: Tomi Bamberger, Cari S, PA-C    Reason for Procedure:   Epigastric ab pain, nausea, history of H pylori  Plan:    EGD  Patient is appropriate for endoscopic procedure(s) in the ambulatory ([facility]) setting.  The nature of the procedure, as well as the risks, benefits, and alternatives were carefully and thoroughly reviewed with the patient. Ample time for discussion and questions allowed. The patient understood, was satisfied, and agreed to proceed.     HPI: Peter Snow is a 27 y.o. male who presents for EGD for evaluation of epigastric ab pain, nausea, history of H pylori .  Patient was most recently seen in the Gastroenterology Clinic on 08/23/23.  No interval change in medical history since that appointment. Please refer to that note for full details regarding GI history and clinical presentation.   Past Medical History:  Diagnosis Date   Anxiety    GERD (gastroesophageal reflux disease)    H. pylori infection    Sinus tachycardia     Past Surgical History:  Procedure Laterality Date   NO PAST SURGERIES      Prior to Admission medications   Medication Sig Start Date End Date Taking? Authorizing Provider  pantoprazole (PROTONIX) 40 MG tablet Take 1 tablet (40 mg total) by mouth 2 (two) times daily. 07/01/23  Yes Mayers, Cari S, PA-C  ergocalciferol (DRISDOL) 1.25 MG (50000 UT) capsule Take 1 capsule (50,000 Units total) by mouth once a week. 02/14/22   Hoy Register, MD  FLUoxetine (PROZAC) 20 MG tablet Take 1 tablet (20 mg total) by mouth daily. 07/01/23   Mayers, Cari S, PA-C  metoprolol succinate (TOPROL-XL) 25 MG 24 hr tablet Take 25 mg by mouth daily. 08/17/23   [provider]    Current Outpatient Medications  Medication Sig Dispense Refill   pantoprazole (PROTONIX) 40 MG tablet Take 1 tablet (40 mg total) by mouth 2 (two) times daily. 60 tablet 1   ergocalciferol (DRISDOL) 1.25 MG (50000 UT)  capsule Take 1 capsule (50,000 Units total) by mouth once a week. 12 capsule 0   FLUoxetine (PROZAC) 20 MG tablet Take 1 tablet (20 mg total) by mouth daily. 90 tablet 0   metoprolol succinate (TOPROL-XL) 25 MG 24 hr tablet Take 25 mg by mouth daily.     Current Facility-Administered Medications  Medication Dose Route Frequency Provider Last Rate Last Admin   0.9 %  sodium chloride infusion  500 mL Intravenous Once Imogene Burn, MD       0.9 %  sodium chloride infusion  500 mL Intravenous Once Imogene Burn, MD        Allergies as of 09/10/2023   (No Known Allergies)    Family History  Problem Relation Age of Onset   Hyperlipidemia Mother     Social History   Socioeconomic History   Marital status: Single    Spouse name: Not on file   Number of children: 0   Years of education: Not on file   Highest education level: Not on file  Occupational History   Occupation: unemployed  Tobacco Use   Smoking status: Never    Passive exposure: Never   Smokeless tobacco: Never  Vaping Use   Vaping status: Never Used  Substance and Sexual Activity   Alcohol use: Never   Drug use: Never   Sexual activity: Not Currently  Other Topics Concern   Not on file  Social History Narrative  Not on file   Social Determinants of Health   Financial Resource Strain: Low Risk  (04/03/2023)   Received from [facility], [facility]   Overall Financial Resource Strain (CARDIA)    Difficulty of Paying Living Expenses: Not hard at all  Food Insecurity: No Food Insecurity (04/03/2023)   Received from [facility], [facility]   Hunger Vital Sign    Worried About Running Out of Food in the Last Year: Never true    Ran Out of Food in the Last Year: Never true  Transportation Needs: No Transportation Needs (04/03/2023)   Received from [facility], [facility]   PRAPARE - Transportation    Lack of Transportation (Medical): No    Lack of Transportation (Non-Medical): No  Physical  Activity: Not on file  Stress: Not on file  Social Connections: Unknown (04/02/2023)   Received from [facility], [facility]   Social Network    Social Network: Not on file  Intimate Partner Violence: Unknown (04/02/2023)   Received from [facility], [facility]   HITS    Physically Hurt: Not on file    Insult or Talk Down To: Not on file    Threaten Physical Harm: Not on file    Scream or Curse: Not on file    Physical Exam: Vital signs in last 24 hours: BP (!) 143/80   Pulse 85   Temp 97.7 F (36.5 C)   Ht 5\' 7"  (1.702 m)   Wt 131 lb (59.4 kg)   SpO2 100%   BMI 20.52 kg/m  GEN: NAD EYE: Sclerae anicteric ENT: MMM CV: Non-tachycardic Pulm: No increased WOB GI: Soft NEURO:  Alert & Oriented   Eulah Pont, MD [facility]   09/10/2023 2:29 PM

## 2023-09-10 NOTE — Progress Notes (Signed)
Called to room to assist during endoscopic procedure.  Patient ID and intended procedure confirmed with present staff. Received instructions for my participation in the procedure from the performing physician.  

## 2023-09-10 NOTE — Patient Instructions (Addendum)
-  await pathology results -Continue present medications Continue take Pantoprazole 40mg  twice a day   YOU HAD AN ENDOSCOPIC PROCEDURE TODAY AT THE [facility]:   Refer to the procedure report that was given to you for any specific questions about what was found during the examination.  If the procedure report does not answer your questions, please call your gastroenterologist to clarify.  If you requested that your care partner not be given the details of your procedure findings, then the procedure report has been included in a sealed envelope for you to review at your convenience later.  YOU SHOULD EXPECT: Some feelings of bloating in the abdomen. Passage of more gas than usual.  Walking can help get rid of the air that was put into your GI tract during the procedure and reduce the bloating. If you had a lower endoscopy (such as a colonoscopy or flexible sigmoidoscopy) you may notice spotting of blood in your stool or on the toilet paper. If you underwent a bowel prep for your procedure, you may not have a normal bowel movement for a few days.  Please Note:  You might notice some irritation and congestion in your nose or some drainage.  This is from the oxygen used during your procedure.  There is no need for concern and it should clear up in a day or so.  Following upper endoscopy (EGD)  Vomiting of blood or coffee ground material  New chest pain or pain under the shoulder blades  Painful or persistently difficult swallowing  New shortness of breath  Fever of 100F or higher  Black, tarry-looking stools  For urgent or emergent issues, a gastroenterologist can be reached at any hour by calling [phone]. Do not use MyChart messaging for urgent concerns.    DIET:  We do recommend a small meal at first, but then you may proceed to your regular diet.  Drink plenty of fluids but you should avoid alcoholic beverages for 24 hours.  ACTIVITY:  You should plan to take it easy for the  rest of today and you should NOT DRIVE or use heavy machinery until tomorrow (because of the sedation medicines used during the test).    FOLLOW UP: Our staff will call the number listed on your records the next business day following your procedure.  We will call around 7:15- 8:00 am to check on you and address any questions or concerns that you may have regarding the information given to you following your procedure. If we do not reach you, we will leave a message.     If any biopsies were taken you will be contacted by phone or by letter within the next 1-3 weeks.  Please call us at [phone] if you have not heard about the biopsies in 3 weeks.    SIGNATURES/CONFIDENTIALITY: You and/or your care partner have signed paperwork which will be entered into your electronic medical record.  These signatures attest to the fact that that the information above on your After Visit Summary has been reviewed and is understood.  Full responsibility of the confidentiality of this discharge information lies with you and/or your care-partner.

## 2023-09-10 NOTE — Op Note (Signed)
[facility] Patient Name: Peter Snow Procedure Date: 09/10/2023 2:59 PM MRN: [id number] Endoscopist: Particia Lather , , [id number] Age: 27 Referring MD:  Date of Birth: 03/25/1996 Gender: Male Account #: [id number] Procedure:                Upper GI endoscopy Indications:              Epigastric abdominal pain, Follow-up of                            Helicobacter pylori, Nausea Medicines:                Monitored Anesthesia Care Procedure:                Pre-Anesthesia Assessment:                           - Prior to the procedure, a History and Physical                            was performed, and patient medications and                            allergies were reviewed. The patient's tolerance of                            previous anesthesia was also reviewed. The risks                            and benefits of the procedure and the sedation                            options and risks were discussed with the patient.                            All questions were answered, and informed consent                            was obtained. Prior Anticoagulants: The patient has                            taken no anticoagulant or antiplatelet agents. ASA                            Grade Assessment: II - A patient with mild systemic                            disease. After reviewing the risks and benefits,                            the patient was deemed in satisfactory condition to                            undergo the procedure.  After obtaining informed consent, the endoscope was                            passed under direct vision. Throughout the                            procedure, the patient's blood pressure, pulse, and                            oxygen saturations were monitored continuously. The                            Olympus Scope [id number] was introduced through the                            mouth, and advanced to the  second part of duodenum.                            The upper GI endoscopy was accomplished without                            difficulty. The patient tolerated the procedure                            well. Scope In: Scope Out: Findings:                 The examined esophagus was normal.                           Localized mildly erythematous mucosa without                            bleeding was found in the gastric antrum. Biopsies                            were taken with a cold forceps for Helicobacter                            pylori testing.                           The examined duodenum was normal. Complications:            No immediate complications. Estimated Blood Loss:     Estimated blood loss was minimal. Impression:               - Normal esophagus.                           - Erythematous mucosa in the antrum. Biopsied.                           - Normal examined duodenum. Recommendation:           - Discharge patient to home (with escort).                           -  Await pathology results.                           - Follow up in GI clinic in 2-3 months.                           - The findings and recommendations were discussed                            with the patient. Dr Particia Lather "[name]" [name],  09/10/2023 3:14:15 PM

## 2023-09-11 ENCOUNTER — Telehealth: Payer: Self-pay

## 2023-09-11 NOTE — Telephone Encounter (Signed)
  Follow up Call-     09/10/2023    2:21 PM 09/04/2023    1:19 PM  Call back number  Post procedure Call Back phone  # [phone]  Permission to leave phone message Yes Yes    Post op call attempted, no answer, no WM

## 2023-09-13 ENCOUNTER — Encounter: Payer: Self-pay | Admitting: Internal Medicine

## 2023-09-13 LAB — SURGICAL PATHOLOGY

## 2023-09-13 NOTE — Progress Notes (Signed)
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 This is the same infection he has had in the past. Recommend bismuth quadruple therapy for treatment: - Tetracycline 500 mg QID x 14 days - Flagyl 250 mg QID x 14 days - Bismuth subsalicylate 524 mg QID x 14 days - PPI BID x 14 days  Please ensure that he is very diligent about taking all these medications and not missing any doses. I will plan to test him for eradication during his next follow up appt with me.

## 2023-09-16 ENCOUNTER — Other Ambulatory Visit: Payer: Self-pay

## 2023-09-16 MED ORDER — TETRACYCLINE HCL 500 MG PO CAPS: 500.0000 mg | ORAL_CAPSULE | Freq: Two times a day (BID) | ORAL | 0 refills | Status: AC

## 2023-09-16 MED ORDER — BISMUTH SUBSALICYLATE 262 MG PO TABS: 2.0000 | ORAL_TABLET | Freq: Four times a day (QID) | ORAL | 0 refills | Status: AC

## 2023-09-16 MED ORDER — METRONIDAZOLE 250 MG PO TABS
250.0000 mg | ORAL_TABLET | Freq: Four times a day (QID) | ORAL | 0 refills | Status: AC
Start: 2023-09-16 — End: 2023-09-30

## 2023-11-05 ENCOUNTER — Encounter: Payer: Self-pay | Admitting: Internal Medicine

## 2023-11-05 ENCOUNTER — Ambulatory Visit (INDEPENDENT_AMBULATORY_CARE_PROVIDER_SITE_OTHER): Payer: PRIVATE HEALTH INSURANCE | Admitting: Internal Medicine

## 2023-11-05 VITALS — BP 110/74 | HR 88 | Ht 67.0 in | Wt 134.0 lb

## 2023-11-05 DIAGNOSIS — R1013 Epigastric pain: Secondary | ICD-10-CM

## 2023-11-05 DIAGNOSIS — R110 Nausea: Secondary | ICD-10-CM

## 2023-11-05 DIAGNOSIS — Z8619 Personal history of other infectious and parasitic diseases: Secondary | ICD-10-CM | POA: Diagnosis not present

## 2023-11-05 DIAGNOSIS — G8929 Other chronic pain: Secondary | ICD-10-CM

## 2023-11-05 NOTE — Progress Notes (Signed)
 Chief Complaint: GERD, history of H. pylori  HPI:    Levi Castillo is a 27 year old Pashto male, with a past medical history as listed below including tachycardia (05/06/2023 echo with LVEF 55-60% and otherwise normal) reflux and H. pylori who presents for follow-up of H. Pylori  Patient previously was diagnosed with H. pylori in 2023 when was at least treated twice.  Interval History: He took his bismuth quadruple H pylori treatment but missed some doses of the medication.  He states it was difficult to follow the regimen fully due to his work schedule.  Ab pain has improved slightly after his treatment. Sometimes his stomach still throbs when he sleeps. This abdominal pain will happen once a week. Sometimes has nausea.  Past Medical History:  Diagnosis Date   Anxiety    GERD (gastroesophageal reflux disease)    H. pylori infection    Sinus tachycardia     Past Surgical History:  Procedure Laterality Date   NO PAST SURGERIES      Current Outpatient Medications  Medication Sig Dispense Refill   ergocalciferol (DRISDOL) 1.25 MG (50000 UT) capsule Take 1 capsule (50,000 Units total) by mouth once a week. (Patient not taking: Reported on 11/05/2023) 12 capsule 0   FLUoxetine (PROZAC) 20 MG tablet Take 1 tablet (20 mg total) by mouth daily. (Patient not taking: Reported on 11/05/2023) 90 tablet 0   metoprolol succinate (TOPROL-XL) 25 MG 24 hr tablet Take 25 mg by mouth daily. (Patient not taking: Reported on 11/05/2023)     pantoprazole (PROTONIX) 40 MG tablet Take 1 tablet (40 mg total) by mouth 2 (two) times daily. (Patient not taking: Reported on 11/05/2023) 60 tablet 1   No current facility-administered medications for this visit.    Allergies as of 11/05/2023   (No Known Allergies)    Family History  Problem Relation Age of Onset   Hyperlipidemia Mother     Social History   Socioeconomic History   Marital status: Single    Spouse name: Not on file   Number of  children: 0   Years of education: Not on file   Highest education level: Not on file  Occupational History   Occupation: unemployed  Tobacco Use   Smoking status: Never    Passive exposure: Never   Smokeless tobacco: Never  Vaping Use   Vaping status: Never Used  Substance and Sexual Activity   Alcohol use: Never   Drug use: Never   Sexual activity: Not Currently  Other Topics Concern   Not on file  Social History Narrative   Not on file   Social Drivers of Health   Financial Resource Strain: Low Risk  (04/03/2023)   Received from [facility], [facility]   Overall Financial Resource Strain (CARDIA)    Difficulty of Paying Living Expenses: Not hard at all  Food Insecurity: No Food Insecurity (04/03/2023)   Received from [facility], [facility]   Hunger Vital Sign    Worried About Running Out of Food in the Last Year: Never true    Ran Out of Food in the Last Year: Never true  Transportation Needs: No Transportation Needs (04/03/2023)   Received from [employer], [facility]   PRAPARE - Transportation    Lack of Transportation (Medical): No    Lack of Transportation (Non-Medical): No  Physical Activity: Not on file  Stress: Not on file  Social Connections: Unknown (04/02/2023)   Received from [facility], [facility]   Social Network  Social Network: Not on file  Intimate Partner Violence: Unknown (04/02/2023)   Received from [facility], [facility]   HITS    Physically Hurt: Not on file    Insult or Talk Down To: Not on file    Threaten Physical Harm: Not on file    Scream or Curse: Not on file    Physical Exam:  Vital signs: BP 110/74   Pulse 88   Ht 5\' 7"  (1.702 m)   Wt 134 lb (60.8 kg)   BMI 20.99 kg/m    Constitutional:   Pleasant male appears to be in NAD, Well developed, Well nourished, alert and cooperative Head:  Normocephalic and atraumatic. Respiratory: Respirations even and unlabored. Cardiovascular: Regular  rate Gastrointestinal:  Soft, nondistended, tender in the periumbilical area.  Neurologic:  Alert and  oriented x4;  grossly normal neurologically.  Psychiatric: Demonstrates good judgement and reason without abnormal affect or behaviors.  RELEVANT LABS AND IMAGING: CBC    Component Value Date/Time   WBC 6.2 03/15/2022 2230   RBC 5.48 03/15/2022 2230   HGB 15.2 03/15/2022 2230   HGB 16.2 11/15/2020 0950   HCT 45.4 03/15/2022 2230   HCT 47.6 11/15/2020 0950   PLT 327 03/15/2022 2230   PLT 304 11/15/2020 0950   MCV 82.8 03/15/2022 2230   MCV 85 11/15/2020 0950   MCH 27.7 03/15/2022 2230   MCHC 33.5 03/15/2022 2230   RDW 12.6 03/15/2022 2230   RDW 12.3 11/15/2020 0950   LYMPHSABS 3.8 11/30/2021 0252   LYMPHSABS 3.6 (H) 11/15/2020 0950   MONOABS 0.8 11/30/2021 0252   EOSABS 0.3 11/30/2021 0252   EOSABS 0.3 11/15/2020 0950   BASOSABS 0.1 11/30/2021 0252   BASOSABS 0.0 11/15/2020 0950    CMP     Component Value Date/Time   NA 137 03/15/2022 2230   NA 140 11/15/2020 0950   K 3.9 03/15/2022 2230   CL 97 (L) 03/15/2022 2230   CO2 30 03/15/2022 2230   GLUCOSE 109 (H) 03/15/2022 2230   BUN 9 03/15/2022 2230   BUN 15 11/15/2020 0950   CREATININE 0.63 03/15/2022 2230   CALCIUM 10.3 03/15/2022 2230   PROT 8.3 (H) 03/15/2022 2230   PROT 7.3 11/15/2020 0950   ALBUMIN 5.1 (H) 03/15/2022 2230   ALBUMIN 4.9 11/15/2020 0950   AST 20 03/15/2022 2230   ALT 20 03/15/2022 2230   ALKPHOS 82 03/15/2022 2230   BILITOT 0.8 03/15/2022 2230   BILITOT 0.5 11/15/2020 0950   GFRNONAA >60 03/15/2022 2230   GFRAA 146 11/15/2020 0950   EGD 09/10/23: - Normal esophagus. - Erythematous mucosa in the antrum. Biopsied. - Normal examined duodenum. Path: FINAL DIAGNOSIS       1. Surgical [P], gastric erythema :       - GASTRIC ANTRAL AND OXYNTIC MUCOSA WITH HELICOBACTER PYLORI-ASSOCIATED       GASTRITIS       - INTESTINAL METAPLASIA OF ANTRAL MUCOSA, NEGATIVE FOR DYSPLASIA       - HELICOBACTER  PYLORI-LIKE ORGANISMS WERE IDENTIFIED ON ROUTINE H&E STAIN   Assessment: History of H. Pylori Epigastric/periumbilical abdominal pain Nausea Patient was able to complete part of his bismuth quadruple H. pylori therapy, but found the treatment regimen difficult to follow.  Patient does state that his abdominal pain has slightly improved after his H. pylori treatment.  Patient continues to have intermittent nausea issues.  Will plan to check his H. pylori stool test today to see if his partial H. pylori  regimen was adequate enough to be able to keep his symptoms under control.  Plan: - Check Diatherix H pylori stool antigen.  Rectal swab was performed in clinic today since patient lives in [place].  Eulah Pont, MD [facility] 11/05/2023, 2:45 PM   I spent 31 minutes of time, including in depth chart review, independent review of results as outlined above, communicating results with the patient directly, face-to-face time with the patient, coordinating care, and ordering studies and medications as appropriate, and documentation.

## 2023-11-05 NOTE — Patient Instructions (Signed)
Your provider has ordered "Diatherix" stool testing for you. You have received a kit from our office today containing all necessary supplies to complete this test. Please carefully read the stool collection instructions provided in the kit before opening the accompanying materials. In addition, be sure to place the label from the top right corner of the laboratory request sheet onto the "puritan opti-swab" tube that is supplied in the kit. This label should include your full name and date of birth. After completing the test, you should secure the purtian tube into the specimen biohazard bag. The laboratory request information sheet (including date and time of specimen collection) should be placed into the outside pocket of the specimen biohazard bag and returned to the [facility] lab with 2 days of collection.   If the laboratory information sheet specimen date and time are not filled out, the test will NOT be performed.  _______________________________________________________  If your blood pressure at your visit was 140/90 or greater, please contact your primary care physician to follow up on this.  _______________________________________________________  If you are age 69 or older, your body mass index should be between 23-30. Your Body mass index is 20.99 kg/m. If this is out of the aforementioned range listed, please consider follow up with your Primary Care Provider.  If you are age 38 or younger, your body mass index should be between 19-25. Your Body mass index is 20.99 kg/m. If this is out of the aformentioned range listed, please consider follow up with your Primary Care Provider.   ________________________________________________________  The [facility] providers would like to encourage you to use [facility] to communicate with providers for non-urgent requests or questions.  Due to long hold times on the telephone, sending your provider a message by [facility] may be a faster and more efficient  way to get a response.  Please allow 48 business hours for a response.  Please remember that this is for non-urgent requests.  _______________________________________________________ Due to recent changes in healthcare laws, you may see the results of your imaging and laboratory studies on MyChart before your provider has had a chance to review them.  We understand that in some cases there may be results that are confusing or concerning to you. Not all laboratory results come back in the same time frame and the provider may be waiting for multiple results in order to interpret others.  Please give [place] 48 hours in order for your provider to thoroughly review all the results before contacting the office for clarification of your results.    Thank you for entrusting me with your care and for choosing [facility], Dr. Eulah Pont

## 2023-11-05 NOTE — Progress Notes (Signed)
Chief Complaint: GERD, history of H. pylori  HPI:    Peter Snow is a 27 year old Pashto male, with a past medical history as listed below including tachycardia (05/06/2023 echo with LVEF 55-60% and otherwise normal) reflux and H. pylori who presents for follow-up of H. Pylori  Patient previously was diagnosed with H. pylori in 2023 when was at least treated twice.  Interval History: He took his bismuth quadruple H pylori treatment but missed some doses of the medication.  He states it was difficult to follow the regimen fully due to his work schedule.  Ab pain has improved slightly after his treatment. Sometimes his stomach still throbs when he sleeps. This abdominal pain will happen once a week. Sometimes has nausea.  Past Medical History:  Diagnosis Date   Anxiety    GERD (gastroesophageal reflux disease)    H. pylori infection    Sinus tachycardia     Past Surgical History:  Procedure Laterality Date   NO PAST SURGERIES      Current Outpatient Medications  Medication Sig Dispense Refill   ergocalciferol (DRISDOL) 1.25 MG (50000 UT) capsule Take 1 capsule (50,000 Units total) by mouth once a week. (Patient not taking: Reported on 11/05/2023) 12 capsule 0   FLUoxetine (PROZAC) 20 MG tablet Take 1 tablet (20 mg total) by mouth daily. (Patient not taking: Reported on 11/05/2023) 90 tablet 0   metoprolol succinate (TOPROL-XL) 25 MG 24 hr tablet Take 25 mg by mouth daily. (Patient not taking: Reported on 11/05/2023)     pantoprazole (PROTONIX) 40 MG tablet Take 1 tablet (40 mg total) by mouth 2 (two) times daily. (Patient not taking: Reported on 11/05/2023) 60 tablet 1   No current facility-administered medications for this visit.    Allergies as of 11/05/2023   (No Known Allergies)    Family History  Problem Relation Age of Onset   Hyperlipidemia Mother     Social History   Socioeconomic History   Marital status: Single    Spouse name: Not on file   Number of  children: 0   Years of education: Not on file   Highest education level: Not on file  Occupational History   Occupation: unemployed  Tobacco Use   Smoking status: Never    Passive exposure: Never   Smokeless tobacco: Never  Vaping Use   Vaping status: Never Used  Substance and Sexual Activity   Alcohol use: Never   Drug use: Never   Sexual activity: Not Currently  Other Topics Concern   Not on file  Social History Narrative   Not on file   Social Drivers of Health   Financial Resource Strain: Low Risk  (04/03/2023)   Received from [facility], [facility]   Overall Financial Resource Strain (CARDIA)    Difficulty of Paying Living Expenses: Not hard at all  Food Insecurity: No Food Insecurity (04/03/2023)   Received from [facility], [facility]   Hunger Vital Sign    Worried About Running Out of Food in the Last Year: Never true    Ran Out of Food in the Last Year: Never true  Transportation Needs: No Transportation Needs (04/03/2023)   Received from [employer], [facility]   PRAPARE - Transportation    Lack of Transportation (Medical): No    Lack of Transportation (Non-Medical): No  Physical Activity: Not on file  Stress: Not on file  Social Connections: Unknown (04/02/2023)   Received from [facility], [facility]   Social Network  Social Network: Not on file  Intimate Partner Violence: Unknown (04/02/2023)   Received from [facility], [facility]   HITS    Physically Hurt: Not on file    Insult or Talk Down To: Not on file    Threaten Physical Harm: Not on file    Scream or Curse: Not on file    Physical Exam:  Vital signs: BP 110/74   Pulse 88   Ht 5\' 7"  (1.702 m)   Wt 134 lb (60.8 kg)   BMI 20.99 kg/m    Constitutional:   Pleasant male appears to be in NAD, Well developed, Well nourished, alert and cooperative Head:  Normocephalic and atraumatic. Respiratory: Respirations even and unlabored. Cardiovascular: Regular  rate Gastrointestinal:  Soft, nondistended, tender in the periumbilical area.  Neurologic:  Alert and  oriented x4;  grossly normal neurologically.  Psychiatric: Demonstrates good judgement and reason without abnormal affect or behaviors.  RELEVANT LABS AND IMAGING: CBC    Component Value Date/Time   WBC 6.2 03/15/2022 2230   RBC 5.48 03/15/2022 2230   HGB 15.2 03/15/2022 2230   HGB 16.2 11/15/2020 0950   HCT 45.4 03/15/2022 2230   HCT 47.6 11/15/2020 0950   PLT 327 03/15/2022 2230   PLT 304 11/15/2020 0950   MCV 82.8 03/15/2022 2230   MCV 85 11/15/2020 0950   MCH 27.7 03/15/2022 2230   MCHC 33.5 03/15/2022 2230   RDW 12.6 03/15/2022 2230   RDW 12.3 11/15/2020 0950   LYMPHSABS 3.8 11/30/2021 0252   LYMPHSABS 3.6 (H) 11/15/2020 0950   MONOABS 0.8 11/30/2021 0252   EOSABS 0.3 11/30/2021 0252   EOSABS 0.3 11/15/2020 0950   BASOSABS 0.1 11/30/2021 0252   BASOSABS 0.0 11/15/2020 0950    CMP     Component Value Date/Time   NA 137 03/15/2022 2230   NA 140 11/15/2020 0950   K 3.9 03/15/2022 2230   CL 97 (L) 03/15/2022 2230   CO2 30 03/15/2022 2230   GLUCOSE 109 (H) 03/15/2022 2230   BUN 9 03/15/2022 2230   BUN 15 11/15/2020 0950   CREATININE 0.63 03/15/2022 2230   CALCIUM 10.3 03/15/2022 2230   PROT 8.3 (H) 03/15/2022 2230   PROT 7.3 11/15/2020 0950   ALBUMIN 5.1 (H) 03/15/2022 2230   ALBUMIN 4.9 11/15/2020 0950   AST 20 03/15/2022 2230   ALT 20 03/15/2022 2230   ALKPHOS 82 03/15/2022 2230   BILITOT 0.8 03/15/2022 2230   BILITOT 0.5 11/15/2020 0950   GFRNONAA >60 03/15/2022 2230   GFRAA 146 11/15/2020 0950   EGD 09/10/23: - Normal esophagus. - Erythematous mucosa in the antrum. Biopsied. - Normal examined duodenum. Path: FINAL DIAGNOSIS       1. Surgical [P], gastric erythema :       - GASTRIC ANTRAL AND OXYNTIC MUCOSA WITH HELICOBACTER PYLORI-ASSOCIATED       GASTRITIS       - INTESTINAL METAPLASIA OF ANTRAL MUCOSA, NEGATIVE FOR DYSPLASIA       - HELICOBACTER  PYLORI-LIKE ORGANISMS WERE IDENTIFIED ON ROUTINE H&E STAIN   Assessment: History of H. Pylori Epigastric/periumbilical abdominal pain Nausea Patient was able to complete part of his bismuth quadruple H. pylori therapy, but found the treatment regimen difficult to follow.  Patient does state that his abdominal pain has slightly improved after his H. pylori treatment.  Patient continues to have intermittent nausea issues.  Will plan to check his H. pylori stool test today to see if his partial H. pylori  regimen was adequate enough to be able to keep his symptoms under control.  Plan: - Check Diatherix H pylori stool antigen.  Rectal swab was performed in clinic today since patient lives in [place].  Eulah Pont, MD [facility] 11/05/2023, 2:45 PM   I spent 31 minutes of time, including in depth chart review, independent review of results as outlined above, communicating results with the patient directly, face-to-face time with the patient, coordinating care, and ordering studies and medications as appropriate, and documentation.

## 2023-11-14 ENCOUNTER — Ambulatory Visit: Payer: Medicaid Other | Admitting: Internal Medicine

## 2023-11-18 ENCOUNTER — Encounter: Payer: Self-pay | Admitting: Internal Medicine

## 2024-01-16 ENCOUNTER — Other Ambulatory Visit: Payer: Self-pay | Admitting: Family Medicine

## 2024-01-29 ENCOUNTER — Other Ambulatory Visit (HOSPITAL_COMMUNITY): Payer: Self-pay

## 2024-08-04 ENCOUNTER — Emergency Department: Admission: EM | Admit: 2024-08-04 | Discharge: 2024-08-04 | Disposition: A | Discharge status: Home / Self Care

## 2024-08-04 ENCOUNTER — Emergency Department

## 2024-08-04 DIAGNOSIS — M545 Low back pain, unspecified: Secondary | ICD-10-CM | POA: Insufficient documentation

## 2024-08-04 LAB — URINALYSIS WITH REFLEX TO MICROSCOPIC EXAM - REFLEX TO CULTURE
Urine Bilirubin: NEGATIVE
Urine Blood: NEGATIVE
Urine Glucose: NEGATIVE
Urine Ketones: NEGATIVE mg/dL
Urine Leukocyte Esterase: NEGATIVE
Urine Nitrite: NEGATIVE
Urine Specific Gravity: 1.029 (ref 1.001–1.035)
Urine Urobilinogen: NORMAL mg/dL (ref 0.2–2.0)
Urine pH: 6 (ref 5.0–8.0)

## 2024-08-04 LAB — COMPREHENSIVE METABOLIC PANEL
ALT: 18 U/L (ref <=55)
AST (SGOT): 27 U/L (ref <=41)
Albumin/Globulin Ratio: 1.5 (ref 0.9–2.2)
Albumin: 4.4 g/dL (ref 3.5–5.0)
Alkaline Phosphatase: 89 U/L (ref 37–117)
Anion Gap: 8 (ref 5.0–15.0)
BUN: 14 mg/dL (ref 9–28)
Bilirubin, Total: 0.7 mg/dL (ref 0.2–1.2)
CO2: 26 meq/L (ref 17–29)
Calcium: 9.4 mg/dL (ref 8.5–10.5)
Chloride: 107 meq/L (ref 99–111)
Creatinine: 0.9 mg/dL (ref 0.5–1.5)
GFR: >60 mL/min/1.73 m2 (ref >=60.0)
Globulin: 2.9 g/dL (ref 2.0–3.6)
Glucose: 111 mg/dL — ABNORMAL HIGH (ref 70–100)
Potassium: 4.5 meq/L (ref 3.5–5.3)
Protein, Total: 7.3 g/dL (ref 6.0–8.3)
Sodium: 141 meq/L (ref 135–145)

## 2024-08-04 LAB — LAB USE ONLY - CBC WITH DIFFERENTIAL
Absolute Basophils: 0.05 x10 3/uL (ref 0.00–0.08)
Absolute Eosinophils: 0.29 x10 3/uL (ref 0.00–0.44)
Absolute Immature Granulocytes: 0.05 x10 3/uL (ref 0.00–0.07)
Absolute Lymphocytes: 4.07 x10 3/uL — ABNORMAL HIGH (ref 0.42–3.22)
Absolute Monocytes: 0.79 x10 3/uL (ref 0.21–0.85)
Absolute Neutrophils: 4.13 x10 3/uL (ref 1.10–6.33)
Absolute nRBC: 0 x10 3/uL (ref <=0.00)
Basophils %: 0.5 %
Eosinophils %: 3.1 %
Hematocrit: 49 % (ref 37.6–49.6)
Hemoglobin: 16.3 g/dL (ref 12.5–17.1)
Immature Granulocytes %: 0.5 %
Lymphocytes %: 43.4 %
MCH: 28 pg (ref 25.1–33.5)
MCHC: 33.3 g/dL (ref 31.5–35.8)
MCV: 84 fL (ref 78.0–96.0)
MPV: 10.2 fL (ref 8.9–12.5)
Monocytes %: 8.4 %
Neutrophils %: 44.1 %
Platelet Count: 325 x10 3/uL (ref 142–346)
Preliminary Absolute Neutrophil Count: 4.13 x10 3/uL (ref 1.10–6.33)
RBC: 5.83 x10 6/uL (ref 4.20–5.90)
RDW: 13 % (ref 11–15)
WBC: 9.38 x10 3/uL (ref 3.10–9.50)
nRBC %: 0 /100{WBCs} (ref <=0.0)

## 2024-08-04 LAB — LIPASE: Lipase: 28 U/L (ref 8–78)

## 2024-08-04 LAB — LAB USE ONLY - URINE GRAY CULTURE HOLD TUBE

## 2024-08-04 MED ORDER — KETOROLAC TROMETHAMINE 30 MG/ML IJ SOLN
30.0000 mg | Freq: Once | INTRAMUSCULAR | Status: AC
Start: 2024-08-04 — End: 2024-08-04
  Administered 2024-08-04: 30 mg via INTRAVENOUS
  Filled 2024-08-04: qty 1

## 2024-08-04 NOTE — Discharge Instructions (Signed)
 As discussed, please return for any new, concerning or worsening symptoms.

## 2024-08-04 NOTE — ED Provider Notes (Signed)
 [facility]  Emergency Department Physician Note     Patient Name: Levi Castillo  Age: 28 y.o. male  Encounter Date:  08/04/2024  Department:[facility] EMERGENCY DEPT  Patient Room: GR5/GR5  PCP: Pcp, None, MD     History of Presenting Illness      Chief Complaint: Back Pain    History Provided By: Patient  History obtained from a source other than the patient: No.  The patient needed interpreter services.  Care in patient's language - patient declined offered interpreter.      Levi Castillo is a 28 y.o. male  PMHx tachycardia with July echo wnl, h pylori, presenting with concern for back pain.  Patient states that 1 week ago, he noticed a change in smell of his urine which has been persistent.  Last night he developed bilateral back pain, worse along the right flank in the absence of an identifiable trigger such as trauma, exertion, etc.  The patient has not had particular dysuria, no hematuria.  He denies polyuria.  He has not had systemic fevers or chills.  He does rate the pain is a 7 out of 10 in severity.  No midline back pain, no numbness or tingling or incontinence.  Patient states that the pain is persistent, and denies any clear exacerbating or relieving factors.  He continues to have intermittent abdominal pain in the setting of known H. pylori, not currently present, no diarrhea or constipation, no melena hematochezia, no headache, neck pain or neck stiffness.  No chest pain or difficulty.  No congestion/rhinorrhea, or other myalgias/arthralgias.  No penile discharge or genital rashes.  Patient takes omeprazole intermittently for his epigastric abdominal discomfort with symptomatic improvement.    _____________________________________________________________________________________    I reviewed patient's last ED visit, clinic visit or admission/discharge summary, as well as associated recent EKGs, lab or imaging results, if applicable.   My ability to evaluate or manage the patient  during this ED visit was directly affected by the following social determinants of health:  English not primary language and lack of primary care provider and/or transportation/accessibility to healthcare    Review of Systems     Please refer to HPI for pertinent positives and negatives.     Physical Exam   Pulse 80  BP (!) 138/96  Resp 18  SpO2 100 %  Temp 97.6 F (36.4 C)    Vital signs have been reviewed by me  Constitutional: Well appearing, non-toxic, no acute distress   HEAD: Normocephalic, atraumatic  EYES: PERRLA, conjunctiva are clear, EOM intact  ENT: Normal hearing. Moist mucous membranes  NECK: Supple, full range of motion, midline trachea, no JVD  CARDIOVASCULAR: Regular rate and regular rhythm, no murmurs rubs or gallops, 2+ radial pulses, intact distal capillary refill, legs are free of edema bilaterally  RESPIRATORY: Regular rate, non-labored, clear and equal breath sounds bilaterally  GI: Active bowel sounds, soft, non-distended and non-tender to palpation, no CVA tenderness  MSK: 5 out of 5 strength proximal and distal muscle groups.  No tenderness to palpation along the paraspinal muscles of the back, no tenderness to palpation along the thoracic or lumbar spine.  No tenderness to palpation along the SI joint.  No tenderness to palpation along the gluteal tendons.  Full range of motion of the torso, no reproducible pain with extension or flexion of the torso.  Patient ambulatory without difficulty..  Negative straight leg raise and cross leg raise  SKIN: Warm and dry, no rash on exposed skin  NEURO: Awake alert and oriented, normal speech and tone, no focal strength or sensory deficits    PSYCH: Cooperative, normal mood and affect, intact memory       Medical Decision Making     Initial Assessment:    28 y.o. male presents with concern for bilateral back pain worse on the right, no clear trigger.  Initial vital signs demonstrate patient to be hypertensive and are otherwise within normal  limits.  Physical exam overall reassuring, patient well-appearing, no acute distress, normal musculoskeletal exam, midline bonytenderness, no abdominal tenderness or CVAT on GI exam.     Unclear etiology of the patient's symptoms, given the patient's clinical appearance and stable vitals, apparently suspect a more benign etiology, possible muscle strain secondary positioning during sleep.  However, given the complaint of change in smell, and atypical flank pain, will pursue workup for UTI nephrolithiasis.  Please reports no high risk recent sexual activity, no rashes, no concern for STI or STD and he has not had any penile discharge.  Assess renal function with CMP. Story does not sound concerning for rhabdomyolysis.     Will initiate symptomatic management Toradol .  Patient agreeable to this plan of care.      ED Course:    0744 hrs patient symptomatically improved status post Toradol .  I probably suspect a more musculoskeletal etiology as on my personal review and independent interpretation of the patient's labs and CT scan, no evidence of acute cystitis, pyelonephritis, nephrolithiasis, or other intra-abdominal etiology.  Patient has normal renal function normal labs, stable vitals and is tolerating p.o.  I recommended close follow-up with his primary care physician, as there is some degree of diagnostic uncertainty today.  Recommended ibuprofen and Tylenol for symptomatic management.  Patient expressed appropriate understanding of this plan of care and was agreeable, ED return precautions were discussed at length    Interpretations   I have personally reviewed and interpreted the patient's laboratory results as the following of clinical significance: see ED course  I have personally reviewed and interpreted the patient's imaging studies as the following of clinical significance: see ED course       Pulse Oximetry Analysis:  Interpreted by me. 100 %% on RA - Normal  Cardiac Monitor: Interpreted by me. Rhythm:   Normal Sinus, Rate:  Normal, Ectopy:  None    Procedures      Procedures       Supplemental Encounter Data   Medical History[7]  Past Surgical History[8]  Social History[9]  Family History[10]  Allergies[11]    Encounter Orders:  Orders Placed This Encounter   Procedures    CT Abdomen Pelvis WO IV/ WO PO Cont    CBC with Differential (Order)    Comprehensive Metabolic Panel    Lipase    Urinalysis with Reflex to Microscopic Exam and Culture    Urine Elnor Culture Hold Tube    CBC with Differential (Component)    Ambulatory referral to [facility]    Diet NPO effective now    Saline lock IV     Medications Administered:  Medications   ketorolac  (TORADOL ) injection 30 mg (30 mg Intravenous Given 08/04/24 0635)     Laboratory and Imaging Studies:  Results for orders placed or performed during the hospital encounter of 08/04/24 (from the past 24 hours)   Comprehensive Metabolic Panel    Collection Time: 08/04/24  6:06 AM   Result Value    Glucose 111 (H)    BUN 14  Creatinine 0.9    Sodium 141    Potassium 4.5    Chloride 107    CO2 26    Calcium 9.4    Anion Gap 8.0    GFR >60.0    AST (SGOT) 27    ALT 18    Alkaline Phosphatase 89    Albumin 4.4    Protein, Total 7.3    Globulin 2.9    Albumin/Globulin Ratio 1.5    Bilirubin, Total 0.7    Collection Time: 08/04/24  6:06 AM   Result Value    Lipase 28   CBC with Differential (Component)    Collection Time: 08/04/24  6:06 AM   Result Value    WBC 9.38    Hemoglobin 16.3    Hematocrit 49.0    Platelet Count 325    MPV 10.2    RBC 5.83    MCV 84.0    MCH 28.0    MCHC 33.3    RDW 13    nRBC % 0.0    Absolute nRBC 0.00    Preliminary Absolute Neutrophil Count 4.13    Neutrophils % 44.1    Lymphocytes % 43.4    Monocytes % 8.4    Eosinophils % 3.1    Basophils % 0.5    Immature Granulocytes % 0.5    Absolute Neutrophils 4.13    Absolute Lymphocytes 4.07 (H)    Absolute Monocytes 0.79    Absolute Eosinophils 0.29    Absolute Basophils 0.05    Absolute Immature  Granulocytes 0.05   Urinalysis with Reflex to Microscopic Exam and Culture    Collection Time: 08/04/24  6:08 AM    Specimen: Urine, Clean Catch   Result Value    Urine Color Straw    Urine Clarity Clear    Urine Specific Gravity 1.029    Urine pH 6.0    Urine Leukocyte Esterase Negative    Urine Nitrite Negative    Urine Protein 10= Trace (A)    Urine Glucose Negative    Urine Ketones Negative    Urine Urobilinogen Normal    Urine Bilirubin Negative    Urine Blood Negative    RBC, UA 0-2    Urine WBC 0-5    Urine Squamous Epithelial Cells 0-5    Urine Mucus Present (A)     CT Abdomen Pelvis WO IV/ WO PO Cont   Final Result      No acute abnormality.      Garnette ROCKFORD Ho, MD   08/04/2024 7:02 AM          Diagnosis/Disposition     Clinical Impression:   1. Acute right-sided low back pain without sciatica         Disposition:   ED Disposition       ED Disposition   Discharge    Condition   --    Date/Time   Tue Aug 04, 2024  7:25 AM    Comment   [facility] discharge to home/self care.    Condition at disposition: Stable                 Discharge Prescription    No medications on file        Provider Counseling: ED return precautions, diagnostic uncertainty  Follow-up: PCP    The above diagnostic process was due to medical necessity based on risk stratification of potential harm of patient's presenting complaint.     Attestations  I am the primary attending physician of record for this patient.      Electronically signed by Dorise Calkin, MD.    This note was generated by the Epic EMR system/Dragon speech recognition and may contain inherent errors or omissions not intended by the user. Grammatical errors, random word insertions, deletions and pronoun errors  are occasional consequences of this technology due to software limitations. Not all errors are caught or corrected. If there are questions or concerns about the content of this note or information contained within the body of this dictation they should be  addressed directly with the author for clarification.         [7]   Past Medical History:  Diagnosis Date    Anxiety     Gastroesophageal reflux disease    [8] History reviewed. No pertinent surgical history.  [9]   Social History  Tobacco Use    Smoking status: Never    Smokeless tobacco: Never   Vaping Use    Vaping status: Never Used   Substance Use Topics    Alcohol use: Never    Drug use: Never   [10] No family history on file.  [11] No Known Allergies       Calkin Dorise, MD  08/04/24 [phone]

## 2024-08-04 NOTE — ED Triage Notes (Signed)
 Levi Castillo is a 28 y.o. male presenting to the ED for bilateral flank pain, worse on the right side and abdominal pain with nausea. Patient endorsing a change in his urine.

## 2024-11-03 ENCOUNTER — Emergency Department
Admission: EM | Admit: 2024-11-03 | Discharge: 2024-11-04 | Disposition: A | Discharge status: Home / Self Care | Attending: Emergency Medicine | Admitting: Emergency Medicine

## 2024-11-03 DIAGNOSIS — R319 Hematuria, unspecified: Secondary | ICD-10-CM | POA: Insufficient documentation

## 2024-11-03 DIAGNOSIS — R519 Headache, unspecified: Secondary | ICD-10-CM | POA: Insufficient documentation

## 2024-11-03 NOTE — ED Triage Notes (Signed)
 [facility] EMERGENCY DEPARTMENT  Provider in Triage Note        Patient Name: Swain Acree    Chief Complaint:   Chief Complaint   Patient presents with    Headache       HPI: Masato Pettie is a 28 y.o. male, who has had a rapid medical screening evaluation initiated by myself.  Pt presents today with a headache and weakness for the past 2-3 days.  No nausea, vomiting, diarrhea, shortness of breath.  He says that he had one episode of chest pain but that resolved quickly on its own.  He also endorses intermittent abdominal pain.    Medical/Surgical/Social history: as per HPI    Vitals: BP 132/89   Pulse (!) 127   Temp 97.4 F (36.3 C) (Oral)   Resp 19   Wt 60.3 kg   SpO2 100%   BMI 19.07 kg/m     Pertinent brief exam:   Examination of area of concern: ambulatory, nad    Preliminary orders: covid, flu    Symptom based preliminary diagnosis/MDM: headache, weakness, abdominal pain    Patient advised to remain in the ED until further evaluation can be performed. Patient instructed to notify staff of any changes in condition while waiting.  This assessment is an initial evaluation to expedite care.

## 2024-11-03 NOTE — ED Triage Notes (Signed)
 Levi Castillo is a 28 y.o. male here for HA started 3days ago, had an episode of CP and blurry vision yesterday which resolved, denies numbness or tingling, denies n/v/d     BP 132/89   Pulse (!) 127   Temp 97.4 F (36.3 C) (Oral)   Resp 19   Wt 60.3 kg   SpO2 100%   BMI 19.07 kg/m

## 2024-11-03 NOTE — ED Provider Notes (Shared)
 [facility]  Emergency Department Physician Note      Diagnosis/Disposition     ED Disposition:  Data Unavailable    ED Diagnosis:  Data Unavailable    History of Present Illness     History Provided By: {SAHPI_1:23370}  History obtained from a source other than the patient: {Hx Alt:67727}  History Limitation: {ARNHXLIMITATION:71167}  Arrival Mode: {ARNARRIVALMODE:71166}  Patient Room: GR11/GR11 ***  PCP: Pcp, None, MD    {Disappearing Text  Use the Action Links below to navigate to specific areas of the chart and the History Hover Bubbles to view history.   Links  Snapshot    Decision Support    Care Everywhere  History  Medical History[1] Past Surgical History[2] Social History[3] Family History[4] Allergies[5] Medications Ordered Prior to Encounter[6]               :[id number]}     Levi Castillo is a 28 y.o. male with ***     History of Present Illness      Physical Exam   Pulse (!) 127  BP 132/89  Resp 19  SpO2 100 %  Temp 97.4 F (36.3 C)    Vitals:    11/03/24 2341   BP: 132/89   Pulse: (!) 127   Resp: 19   Temp: 97.4 F (36.3 C)   TempSrc: Oral   SpO2: 100%   Weight: 60.3 kg       Pulse Oximetry Interpretation: {ARNO2INTERPRETATION:71165}  ED MD Monitor Interpretation: Rate:***; Rhythm: {ZXH:77702}    Physical Exam   Medical Decision Making     I am the first provider for this patient.    I reviewed the vital signs, available nursing notes, allergies, past medical history, past surgical history, family history and social history. If pertinent, they are mentioned in HPI.     Personal Protective Equipment (PPE)  Gloves and surgical mask.    Provider Notes/Summary:     28 y.o. male with ***      Vitals stable. Exam as noted. ***EKG negative for acute ischemic changes or arrhythmia. ***Labs notable for normal WBC, normal H&H (no evidence of acute anemia), normal platelets, no left shift, unremarkable CMP with normal renal and liver function tests, unremarkable electrolytes. Lipase  ***normal; no evidence of acute pancreatitis. ***UA negative for evidence of hematuria or UTI.  ***Pregnancy negative.  ***Troponin within normal limits. *** BNP within normal limits. ***D-dimer within normal limits, therefore low suspicion of PE or DVT. ***Lactic normal; redrawn and noted to {ARNLACTIC:71190}.    XR performed of {ARNXRAYIMAGING:67567::chest} and negative for acute findings; negative for infiltrate, cardiomegaly, PTX, or pulmonary edema; notable for ***.    US  performed of {ARNUSTYPE:71164} and negative for acute findings; notable for ***.     CT performed of {ARNCTIMAGING:67566} and negative for acute findings; notable for ***.    Labs and imaging discussed with patient. Treatment provided includes ***.     Considered ***, but not *** indicated as I do not suspect ***.    ***Discharged home in stable condition with recommendation to follow-up with PCP for reevaluation.  Strict return precautions provided. Shared decision made with ***patient/patient family/parent/guardian/POA who agrees with plan.     *** Admitted to {ARNDISPOSITION:67563} in stable condition.     *** Transferred to *** in stable condition.     SDOH: {JMWDINY:30611}  Social Drivers of Health with Concerns     Alcohol Use: Not on file   Physical Activity: Not on file   Stress:  Not on file   Social Connections: Unknown (04/02/2023)    Received from [facility]    Social Network     Social Network: Not on file   Depression: At Risk (04/03/2023)    Received from [facility]    Depression     PHQ Total Score: 9   Health Literacy: Not on file         For Hospitalized Patients:    Hospitalization Decision Time:    I have discussed this case with Dr. PIERRETTE {AdmittingService:53790} at *** on 11/03/2024, who accepts patient for admission and requests {Obs vs Inpatient:53791} {Dispo Unit:53792} bed.     For Surgical/Procedural Admissions:    Anticoagulated: {YES/NO:21936}. If yes, name of medication: ***  Last PO intake: ***  Current NPO  status: ***     Consultant(s):     I have discussed this case with consultant Dr. PIERRETTE at *** on 11/03/2024.  Recommendations were as follows: ***    ***Smoking Cessation: Patient has been counseled on smoking cessation for more than 3 minutes, including Nicorette gum, Nicotrol inhalers, 1-800-NoButts, etc. Patient has been smoking for *** years. Patient ***will/is not considering quitting. Patient instructed to follow up with PCP for further counseling/outpatient management.     Escalation of care considered: I considered admission, however, given the improvement in symptoms and reassuring workup, patient is appropriate for discharge and outpatient care. Risk of hospitalization outweighs the benefits. Patient is resting comfortably, well hydrated, tolerating PO, normal neuro exam, lungs CTA, abdomen soft, NTND, ***not requiring supplemental O2/supportive care/IV medications or IVF, etc. Ambulating ***well/at baseline. Do not suspect life threatening condition at this time.     {Disappearing Text  Use the Action and Data Review Links below to navigate to specific areas of the chart.  Action Links  Snapshot    Orders    Decision Support    Secure Chat    Transfer Documents    Patient Care Timeline    Workup    Dispo  Data Review Links Results Review    EKG    Labs    Imaging    Media              :[id number]}     Initial Differential Diagnosis:  Initial differential diagnosis to include but not limited to: {IIK:38527}       Medical Decision Making      Final Impression:  ***  {DISPO NEUPNWD:38529}    {Records Reviewed (internal and external)? (Optional):61520::N/A}  {Was management discussed with a consultant? (Optional):61519::N/A}  {Diagnostic test considered and not performed (Optional):61521::N/A}  {Prescription medications considered and not given (Optional):61522::N/A}  {Hospitalization considered but not done (Optional):61523}  {Was the decision around the need for surgery discussed with a  consultant? (Optional):61527::N/A}  {Social Determinants of Health Considerations (Optional):61524::N/A}  {Was there decision to not resuscitate or to de-escalate care due to poor prognosis? (Optional):61525::N/A}    The following USACS CMT is used for risk management:   {[facility] ED JOO:695940672}                    Procedures   {Please remember to document critical care time in Procedure SmartBlock if indicated:62366}   Procedures        External Record Review     I reviewed and interpreted prior non-ED medical record specialty:     Note Date: ***  Specialty: ***  Demonstrated: ***      Supplemental Encounter Data   Medical History[7]  Past Surgical History[8]  Social History[9]  Family History[10]  Allergies[11]    Encounter Orders:  Orders Placed This Encounter   Procedures    COVID-19 and Influenza ([place]) (symptomatic)    CBC with Differential (Order)    Comprehensive Metabolic Panel    Lipase    Urinalysis with Reflex to Microscopic Exam and Culture    Urine Elnor Culture Hold Tube     Medications Administered:  Medications - No data to display  Laboratory and Imaging Studies:     No orders to display       Attestations     I am the primary attending physician of record for this patient.            [1]   Past Medical History:  Diagnosis Date    Anxiety     Gastroesophageal reflux disease    [2] No past surgical history on file.  [3]   Social History  Tobacco Use    Smoking status: Never    Smokeless tobacco: Never   Vaping Use    Vaping status: Never Used   Substance Use Topics    Alcohol use: Never    Drug use: Never   [4] No family history on file.  [5] No Known Allergies  [6]   No current facility-administered medications on file prior to encounter.     Current Outpatient Medications on File Prior to Encounter   Medication Sig Dispense Refill    ondansetron  (ZOFRAN -ODT) 4 MG disintegrating tablet Take 1 tablet (4 mg) by mouth every 6 (six) hours as needed for Nausea 8 tablet 0    sucralfate  (CARAFATE ) 1  g tablet Take 1 tablet (1 g) by mouth 4 (four) times daily 30 tablet 0   [7]   Past Medical History:  Diagnosis Date    Anxiety     Gastroesophageal reflux disease    [8] No past surgical history on file.  [9]   Social History  Tobacco Use    Smoking status: Never    Smokeless tobacco: Never   Vaping Use    Vaping status: Never Used   Substance Use Topics    Alcohol use: Never    Drug use: Never   [10] No family history on file.  [11] No Known Allergies

## 2024-11-04 ENCOUNTER — Emergency Department

## 2024-11-04 LAB — LAB USE ONLY - CBC WITH DIFFERENTIAL
Absolute Basophils: 0.05 x10 3/uL (ref 0.00–0.08)
Absolute Eosinophils: 0.17 x10 3/uL (ref 0.00–0.44)
Absolute Immature Granulocytes: 0.02 x10 3/uL (ref 0.00–0.07)
Absolute Lymphocytes: 3.79 x10 3/uL — ABNORMAL HIGH (ref 0.42–3.22)
Absolute Monocytes: 0.77 x10 3/uL (ref 0.21–0.85)
Absolute Neutrophils: 4.63 x10 3/uL (ref 1.10–6.33)
Absolute nRBC: 0 x10 3/uL (ref <=0.00)
Basophils %: 0.5 %
Eosinophils %: 1.8 %
Hematocrit: 46.7 % (ref 37.6–49.6)
Hemoglobin: 15.9 g/dL (ref 12.5–17.1)
Immature Granulocytes %: 0.2 %
Lymphocytes %: 40.2 %
MCH: 28.3 pg (ref 25.1–33.5)
MCHC: 34 g/dL (ref 31.5–35.8)
MCV: 83.1 fL (ref 78.0–96.0)
MPV: 10 fL (ref 8.9–12.5)
Monocytes %: 8.2 %
Neutrophils %: 49.1 %
Platelet Count: 322 x10 3/uL (ref 142–346)
Preliminary Absolute Neutrophil Count: 4.63 x10 3/uL (ref 1.10–6.33)
RBC: 5.62 x10 6/uL (ref 4.20–5.90)
RDW: 12 % (ref 11–15)
WBC: 9.43 x10 3/uL (ref 3.10–9.50)
nRBC %: 0 /100{WBCs} (ref <=0.0)

## 2024-11-04 LAB — COMPREHENSIVE METABOLIC PANEL
ALT: 21 U/L (ref <=55)
AST (SGOT): 25 U/L (ref <=41)
Albumin/Globulin Ratio: 1.8 (ref 0.9–2.2)
Albumin: 5.3 g/dL — ABNORMAL HIGH (ref 3.8–5.0)
Alkaline Phosphatase: 97 U/L (ref 37–117)
Anion Gap: 12 (ref 5.0–15.0)
BUN: 16 mg/dL (ref 9–28)
Bilirubin, Total: 0.5 mg/dL (ref 0.2–1.2)
CO2: 27 meq/L (ref 17–29)
Calcium: 10 mg/dL (ref 8.5–10.5)
Chloride: 105 meq/L (ref 99–111)
Creatinine: 0.7 mg/dL (ref 0.5–1.5)
GFR: >60 mL/min/1.73 m2 (ref >=60.0)
Globulin: 3 g/dL (ref 2.0–3.6)
Glucose: 110 mg/dL — ABNORMAL HIGH (ref 70–100)
Potassium: 3.8 meq/L (ref 3.5–5.3)
Protein, Total: 8.3 g/dL (ref 6.0–8.3)
Sodium: 144 meq/L (ref 135–145)

## 2024-11-04 LAB — URINALYSIS WITH REFLEX TO MICROSCOPIC EXAM - REFLEX TO CULTURE
Urine Bilirubin: NEGATIVE
Urine Blood: NEGATIVE
Urine Glucose: NEGATIVE
Urine Ketones: NEGATIVE mg/dL
Urine Leukocyte Esterase: NEGATIVE
Urine Nitrite: NEGATIVE
Urine Protein: NEGATIVE
Urine Specific Gravity: 1.014 (ref 1.001–1.035)
Urine Urobilinogen: NORMAL mg/dL (ref 0.2–2.0)
Urine pH: 7 (ref 5.0–8.0)

## 2024-11-04 LAB — COVID-19 (SARS-COV-2) & INFLUENZA  A/B, NAA (ROCHE LIAT)
Influenza A RNA: NOT DETECTED
Influenza B RNA: NOT DETECTED
SARS-CoV-2 (COVID-19) RNA: NOT DETECTED

## 2024-11-04 LAB — LAB USE ONLY - URINE GRAY CULTURE HOLD TUBE

## 2024-11-04 LAB — LIPASE: Lipase: 31 U/L (ref 8–78)

## 2024-11-04 MED ORDER — KETOROLAC TROMETHAMINE 30 MG/ML IJ SOLN
30.0000 mg | Freq: Once | INTRAMUSCULAR | Status: AC
  Administered 2024-11-04: 02:00:00 30 mg via INTRAVENOUS
  Filled 2024-11-04: qty 1

## 2024-11-04 MED ORDER — IOHEXOL 350 MG/ML IV SOLN
100.0000 mL | Freq: Once | INTRAVENOUS | Status: AC | PRN
  Administered 2024-11-04: 01:00:00 90 mL via INTRAVENOUS
  Filled 2024-11-04: qty 100

## 2024-11-04 MED ORDER — MAGNESIUM SULFATE IN D5W 1-5 GM/100ML-% IV SOLN
1.0000 g | Freq: Once | INTRAVENOUS | Status: AC
  Administered 2024-11-04: 02:00:00 1 g via INTRAVENOUS
  Filled 2024-11-04: qty 100

## 2024-11-04 MED ORDER — SODIUM CHLORIDE 0.9 % IV BOLUS
1000.0000 mL | Freq: Once | INTRAVENOUS | Status: AC
  Administered 2024-11-04: 01:00:00 1000 mL via INTRAVENOUS
  Filled 2024-11-04: qty 1000

## 2024-11-04 NOTE — Discharge Instructions (Signed)
 Please follow-up with your primary care provider for reevaluation of your medical condition.  Should you notice a recurrence or worsening headache, blurred vision, dizziness, numbness/tingling of your arms or legs, difficulty walking, weakness, or other concerning symptoms, please return to the ER for further evaluation.

## 2024-11-30 ENCOUNTER — Encounter: Payer: Self-pay | Admitting: Physician Assistant

## 2024-11-30 ENCOUNTER — Ambulatory Visit: Admitting: Physician Assistant

## 2024-11-30 VITALS — BP 131/87 | HR 87

## 2024-11-30 DIAGNOSIS — E559 Vitamin D deficiency, unspecified: Secondary | ICD-10-CM | POA: Diagnosis not present

## 2024-11-30 DIAGNOSIS — F411 Generalized anxiety disorder: Secondary | ICD-10-CM

## 2024-11-30 DIAGNOSIS — K219 Gastro-esophageal reflux disease without esophagitis: Secondary | ICD-10-CM

## 2024-11-30 MED ORDER — VITAMIN D (ERGOCALCIFEROL) 1.25 MG (50000 UNIT) PO CAPS: 50000.0000 [IU] | ORAL_CAPSULE | ORAL | 2 refills | Status: AC

## 2024-11-30 NOTE — Patient Instructions (Signed)
 VISIT SUMMARY:  During your visit, we discussed your worsening gastrointestinal symptoms, anxiety, and vitamin D  deficiency. We reviewed your current medications and previous test results, and we have made some adjustments to your treatment plan to help manage your symptoms more effectively.  YOUR PLAN:  -GENERALIZED ANXIETY DISORDER: Generalized anxiety disorder is a condition characterized by persistent and excessive worry about various aspects of life. To help stabilize your mood and reduce your symptoms, you will start taking Lexapro  5 mg daily for 6 weeks. We also discussed considering magnesium glycinate for sleep support. Please follow up in 6 weeks to evaluate your response to the treatment.  -GASTROESOPHAGEAL REFLUX DISEASE: Gastroesophageal reflux disease (GERD) is a condition where stomach acid frequently flows back into the tube connecting your mouth and stomach, causing irritation. Despite taking omeprazole , your symptoms have worsened. We recommend continuing omeprazole  40 mg daily and adding a probiotic supplement to help balance your gut.  -VITAMIN D  DEFICIENCY: Vitamin D  deficiency occurs when you don't have enough vitamin D  in your body, which is important for bone health and energy levels. Your previous level was very low at 9 ng/mL. We have prescribed a vitamin D  supplement, one tablet weekly for 12 weeks, to help improve your levels.  INSTRUCTIONS:  Please follow up in 6 weeks to evaluate your response to the Lexapro  and other treatments. Continue taking your medications as prescribed and consider adding the probiotic supplement and magnesium glycinate as discussed.

## 2024-11-30 NOTE — Progress Notes (Unsigned)
 "  Established Patient Office Visit  Subjective   Patient ID: Peter Snow, male    DOB: 08-25-1996  Age: 29 y.o. MRN: [id number]  Chief Complaint  Patient presents with   Abdominal Pain    He states after he eats, his stomach feels full and like it is going up his throat and he can not sleep   Anxiety    He reports feelings of his hands and feet getting warm and shaky. And he worries all the time and is unable to make a decision  Discussed the use of AI scribe software for clinical note transcription with the patient, who gave verbal consent to proceed.  History of Present Illness  History of Present Illness Aedan Geimer is a 29 year old male who presents with worsening gastrointestinal symptoms and anxiety.  He has long-standing gastrointestinal symptoms with a negative endoscopy and biopsy about a year ago and negative follow-up tests. Over the past few weeks his stomach symptoms have worsened, with increased nighttime sensitivity, discomfort, and pressure that keep him awake most nights. He takes omeprazole  40 mg without relief. He previously took pantoprazole  but is unsure who prescribed it.  He has anxiety with warm hands and feet, difficulty making decisions, and forgetfulness. He briefly took fluoxetine  in the past and stopped when he felt better. He uses walking, sitting in his car, and calling friends for coping. He is not taking Lexapro , which was prescribed for anxiety, due to fears of dependency and confusion about its purpose. He was also given magnesium supplements but is unsure which type to use.  He recently had a CT scan of the abdomen and blood work for kidney pain after eating. Results were normal. He was treated with Pyridium at urgent care.  He previously had a vitamin D  level of 9 ng/mL and has not been taking vitamin D . He is concerned about low energy and weakness.  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  Physical Exam GENERAL: Alert, cooperative, well developed, no acute distress. HEENT: Normocephalic, normal oropharynx, moist mucous membranes. CHEST: Clear to auscultation bilaterally, no wheezes, rhonchi, or crackles. CARDIOVASCULAR: Normal heart rate and rhythm, S1 and S2 normal without murmurs. ABDOMEN: Soft, non-tender, non-distended, without organomegaly, normal bowel sounds. EXTREMITIES: No cyanosis or edema. NEUROLOGICAL: Cranial nerves grossly intact, moves all extremities without gross motor or sensory deficit.  Results Radiology CT abdomen: Unremarkable  Assessment and Plan Generalized anxiety disorder Low-level anxiety causing sleep and gastrointestinal issues. Previously on fluoxetine , now off medication. Lexapro  recommended to stabilize mood and reduce symptoms. - Start Lexapro  5 mg daily for 6 weeks. - Consider magnesium glycinate for sleep support. - Follow up in 6 weeks to evaluate response.  Gastroesophageal reflux disease Worsening symptoms despite omeprazole . Anxiety may contribute to symptoms. Probiotics suggested for gut balance. - Continue omeprazole  40 mg daily. - Add probiotic supplement.  Vitamin D  deficiency Previous level of 9. Not on supplements. Discussed importance of adequate levels. - Prescribed vitamin D  supplementation, one tablet weekly for 12 weeks.   HPI  {History (Optional):23778}  ROS    Objective:     BP 131/87 (BP Location: Left Arm, Patient Position: Sitting, Cuff Size: Normal)   Pulse 87   SpO2 99%  {Vitals History (Optional):23777}  Physical Exam   No results found for any visits on 11/30/24.  {Labs (Optional):23779}  The ASCVD Risk score (Arnett DK, et al., 2019) failed to calculate  for the following reasons:   The 2019 ASCVD risk score is only valid for ages 20 to 10   * - Cholesterol units were assumed    Assessment & Plan:   Problem List Items Addressed This Visit    None   No follow-ups on file.    Kirk RAMAN Mayers, PA-C  "

## 2024-12-01 ENCOUNTER — Encounter: Payer: Self-pay | Admitting: Physician Assistant
